# Patient Record
Sex: Female | Born: 1970
Health system: Southern US, Community
[De-identification: ages and names within clinical notes are randomized; demographics above are authoritative.]

## PROBLEM LIST (undated history)

## (undated) DIAGNOSIS — Z789 Other specified health status: Secondary | ICD-10-CM

## (undated) HISTORY — PX: NO PAST SURGERIES: SHX2092

## (undated) HISTORY — DX: Other specified health status: Z78.9

---

## 2003-10-30 ENCOUNTER — Other Ambulatory Visit: Admission: RE | Admit: 2003-10-30 | Discharge: 2003-10-30 | Payer: Self-pay | Admitting: Gynecology

## 2004-10-30 ENCOUNTER — Other Ambulatory Visit: Admission: RE | Admit: 2004-10-30 | Discharge: 2004-10-30 | Payer: Self-pay | Admitting: Gynecology

## 2005-11-02 ENCOUNTER — Other Ambulatory Visit: Admission: RE | Admit: 2005-11-02 | Discharge: 2005-11-02 | Payer: Self-pay | Admitting: Gynecology

## 2006-08-18 ENCOUNTER — Ambulatory Visit (HOSPITAL_COMMUNITY): Admission: RE | Admit: 2006-08-18 | Discharge: 2006-08-18 | Payer: Self-pay | Admitting: Gynecology

## 2006-11-04 ENCOUNTER — Other Ambulatory Visit: Admission: RE | Admit: 2006-11-04 | Discharge: 2006-11-04 | Payer: Self-pay | Admitting: Gynecology

## 2007-05-27 ENCOUNTER — Ambulatory Visit (HOSPITAL_COMMUNITY): Admission: RE | Admit: 2007-05-27 | Discharge: 2007-05-27 | Payer: Self-pay | Admitting: Chiropractic Medicine

## 2007-11-10 ENCOUNTER — Other Ambulatory Visit: Admission: RE | Admit: 2007-11-10 | Discharge: 2007-11-10 | Payer: Self-pay | Admitting: Gynecology

## 2008-02-28 ENCOUNTER — Ambulatory Visit (HOSPITAL_COMMUNITY): Admission: RE | Admit: 2008-02-28 | Discharge: 2008-02-28 | Payer: Self-pay | Admitting: Chiropractic Medicine

## 2008-11-22 ENCOUNTER — Ambulatory Visit: Payer: Self-pay | Admitting: Women's Health

## 2008-11-22 ENCOUNTER — Encounter: Payer: Self-pay | Admitting: Women's Health

## 2008-11-22 ENCOUNTER — Other Ambulatory Visit: Admission: RE | Admit: 2008-11-22 | Discharge: 2008-11-22 | Payer: Self-pay | Admitting: Gynecology

## 2009-11-06 ENCOUNTER — Ambulatory Visit: Payer: Self-pay | Admitting: Women's Health

## 2009-11-06 ENCOUNTER — Other Ambulatory Visit: Admission: RE | Admit: 2009-11-06 | Discharge: 2009-11-06 | Payer: Self-pay | Admitting: Gynecology

## 2010-05-13 ENCOUNTER — Ambulatory Visit (HOSPITAL_COMMUNITY): Admission: RE | Admit: 2010-05-13 | Discharge: 2010-05-13 | Payer: Self-pay | Admitting: Gynecology

## 2010-11-10 ENCOUNTER — Other Ambulatory Visit: Payer: Self-pay | Admitting: Women's Health

## 2010-11-10 ENCOUNTER — Encounter (INDEPENDENT_AMBULATORY_CARE_PROVIDER_SITE_OTHER): Payer: Managed Care, Other (non HMO) | Admitting: Women's Health

## 2010-11-10 ENCOUNTER — Other Ambulatory Visit (HOSPITAL_COMMUNITY)
Admission: RE | Admit: 2010-11-10 | Discharge: 2010-11-10 | Disposition: A | Discharge status: Home / Self Care | Payer: Managed Care, Other (non HMO) | Source: Ambulatory Visit | Attending: Gynecology | Admitting: Gynecology

## 2010-11-10 DIAGNOSIS — Z124 Encounter for screening for malignant neoplasm of cervix: Secondary | ICD-10-CM | POA: Insufficient documentation

## 2010-11-10 DIAGNOSIS — Z01419 Encounter for gynecological examination (general) (routine) without abnormal findings: Secondary | ICD-10-CM

## 2010-11-10 DIAGNOSIS — R823 Hemoglobinuria: Secondary | ICD-10-CM

## 2010-12-26 ENCOUNTER — Ambulatory Visit (INDEPENDENT_AMBULATORY_CARE_PROVIDER_SITE_OTHER): Payer: Managed Care, Other (non HMO) | Admitting: Women's Health

## 2010-12-26 DIAGNOSIS — B3731 Acute candidiasis of vulva and vagina: Secondary | ICD-10-CM

## 2010-12-26 DIAGNOSIS — R809 Proteinuria, unspecified: Secondary | ICD-10-CM

## 2010-12-26 DIAGNOSIS — R350 Frequency of micturition: Secondary | ICD-10-CM

## 2010-12-26 DIAGNOSIS — B373 Candidiasis of vulva and vagina: Secondary | ICD-10-CM

## 2011-04-07 ENCOUNTER — Other Ambulatory Visit: Payer: Self-pay | Admitting: Gynecology

## 2011-04-07 DIAGNOSIS — Z1231 Encounter for screening mammogram for malignant neoplasm of breast: Secondary | ICD-10-CM

## 2011-05-15 ENCOUNTER — Ambulatory Visit (HOSPITAL_COMMUNITY)
Admission: RE | Admit: 2011-05-15 | Discharge: 2011-05-15 | Disposition: A | Discharge status: Home / Self Care | Payer: Managed Care, Other (non HMO) | Source: Ambulatory Visit | Attending: Gynecology | Admitting: Gynecology

## 2011-05-15 ENCOUNTER — Telehealth: Payer: Self-pay | Admitting: *Deleted

## 2011-05-15 DIAGNOSIS — Z1231 Encounter for screening mammogram for malignant neoplasm of breast: Secondary | ICD-10-CM

## 2011-05-15 DIAGNOSIS — Z139 Encounter for screening, unspecified: Secondary | ICD-10-CM

## 2011-05-15 NOTE — Telephone Encounter (Signed)
Pt called needing a glucose level check for her insurance company. Per [place] pt can come in office to have lab drawn.

## 2011-05-18 ENCOUNTER — Other Ambulatory Visit (INDEPENDENT_AMBULATORY_CARE_PROVIDER_SITE_OTHER): Payer: Managed Care, Other (non HMO) | Admitting: *Deleted

## 2011-05-18 DIAGNOSIS — Z139 Encounter for screening, unspecified: Secondary | ICD-10-CM

## 2011-10-30 ENCOUNTER — Encounter: Payer: Self-pay | Admitting: Women's Health

## 2011-11-11 ENCOUNTER — Encounter: Payer: Self-pay | Admitting: Women's Health

## 2011-11-11 ENCOUNTER — Ambulatory Visit (INDEPENDENT_AMBULATORY_CARE_PROVIDER_SITE_OTHER): Payer: Managed Care, Other (non HMO) | Admitting: Women's Health

## 2011-11-11 VITALS — BP 100/68 | Ht 65.0 in | Wt 119.0 lb

## 2011-11-11 DIAGNOSIS — E079 Disorder of thyroid, unspecified: Secondary | ICD-10-CM

## 2011-11-11 DIAGNOSIS — Z13220 Encounter for screening for lipoid disorders: Secondary | ICD-10-CM

## 2011-11-11 DIAGNOSIS — Z01419 Encounter for gynecological examination (general) (routine) without abnormal findings: Secondary | ICD-10-CM

## 2011-11-11 DIAGNOSIS — Z309 Encounter for contraceptive management, unspecified: Secondary | ICD-10-CM

## 2011-11-11 DIAGNOSIS — Z833 Family history of diabetes mellitus: Secondary | ICD-10-CM

## 2011-11-11 DIAGNOSIS — IMO0001 Reserved for inherently not codable concepts without codable children: Secondary | ICD-10-CM

## 2011-11-11 LAB — CBC WITH DIFFERENTIAL/PLATELET
Eosinophils Relative: 2 % (ref 0–5)
HCT: 39.7 % (ref 36.0–46.0)
Lymphocytes Relative: 40 % (ref 12–46)
Lymphs Abs: 1.3 10*3/uL (ref 0.7–4.0)
MCV: 95.9 fL (ref 78.0–100.0)
Monocytes Absolute: 0.3 10*3/uL (ref 0.1–1.0)
Neutro Abs: 1.7 10*3/uL (ref 1.7–7.7)
Platelets: 178 10*3/uL (ref 150–400)
RBC: 4.14 MIL/uL (ref 3.87–5.11)
WBC: 3.3 10*3/uL — ABNORMAL LOW (ref 4.0–10.5)

## 2011-11-11 LAB — LIPID PANEL
Cholesterol: 169 mg/dL (ref 0–200)
HDL: 66 mg/dL (ref >39)
LDL Cholesterol: 83 mg/dL (ref 0–99)
Triglycerides: 101 mg/dL (ref <150)

## 2011-11-11 LAB — TSH: TSH: 0.988 u[IU]/mL (ref 0.350–4.500)

## 2011-11-11 MED ORDER — ETONOGESTREL-ETHINYL ESTRADIOL 0.12-0.015 MG/24HR VA RING: VAGINAL_RING | VAGINAL | Status: DC

## 2011-11-11 NOTE — Patient Instructions (Signed)

## 2011-11-11 NOTE — Progress Notes (Signed)
Linda Lewis 1971-03-16 [id number]    History:    The patient presents for annual exam.  Monthly 4-5 day cycle on NuvaRing. History of normal Paps and mammograms.   Past medical history, past surgical history, family history and social history were all reviewed and documented in the EPIC chart. Works as a Museum/gallery conservator in a lab. Daughter Linda Lewis, 3 student at [employer] doing well, has had gardasil.   ROS:  A  ROS was performed and pertinent positives and negatives are included in the history.  Exam:  Filed Vitals:   11/11/11 0847  BP: 100/68    General appearance:  Normal Head/Neck:  Normal, without cervical or supraclavicular adenopathy. Thyroid:  Symmetrical, normal in size, without palpable masses or nodularity. Respiratory  Effort:  Normal  Auscultation:  Clear without wheezing or rhonchi Cardiovascular  Auscultation:  Regular rate, without rubs, murmurs or gallops  Edema/varicosities:  Not grossly evident Abdominal  Soft,nontender, without masses, guarding or rebound.  Liver/spleen:  No organomegaly noted  Hernia:  None appreciated  Skin  Inspection:  Grossly normal  Palpation:  Grossly normal Neurologic/psychiatric  Orientation:  Normal with appropriate conversation.  Mood/affect:  Normal  Genitourinary    Breasts: Examined lying and sitting.     Right: Without masses, retractions, discharge or axillary adenopathy.     Left: Without masses, retractions, discharge or axillary adenopathy.   Inguinal/mons:  Normal without inguinal adenopathy  External genitalia:  Normal  BUS/Urethra/Skene's glands:  Normal  Bladder:  Normal  Vagina:  Normal  Cervix:  Normal  Uterus:   normal in size, shape and contour.  Midline and mobile  Adnexa/parametria:     Rt: Without masses or tenderness.   Lt: Without masses or tenderness.  Anus and perineum: Normal  Digital rectal exam: Normal sphincter tone without palpated masses or tenderness  Assessment/Plan:  40 y.o. MWF G1 P1 for  annual exam with no complaints.  Normal GYN exam on nuva ring contraception History of  normal Paps  Plan: SBE's, continue annual mammogram, continue healthy lifestyle of diet and exercise, vitamin D 1000 daily encouraged. Nuva ring prescription, proper use, slight risk for blood clots and strokes reviewed. CBC, glucose, TSH, lipid profile, UA. ACOG's pap screening  recommendations reviewed.  Harrington Challenger [facility], 10:38 AM 11/11/2011

## 2011-11-12 LAB — URINALYSIS W MICROSCOPIC + REFLEX CULTURE
Bacteria, UA: NONE SEEN
Bilirubin Urine: NEGATIVE
Casts: NONE SEEN
Crystals: NONE SEEN
Hgb urine dipstick: NEGATIVE
Ketones, ur: NEGATIVE mg/dL
Nitrite: NEGATIVE
pH: 8 (ref 5.0–8.0)

## 2012-02-24 ENCOUNTER — Ambulatory Visit (INDEPENDENT_AMBULATORY_CARE_PROVIDER_SITE_OTHER): Payer: Managed Care, Other (non HMO) | Admitting: Women's Health

## 2012-02-24 ENCOUNTER — Encounter: Payer: Self-pay | Admitting: Women's Health

## 2012-02-24 ENCOUNTER — Other Ambulatory Visit: Payer: Self-pay | Admitting: Women's Health

## 2012-02-24 DIAGNOSIS — R102 Pelvic and perineal pain: Secondary | ICD-10-CM

## 2012-02-24 DIAGNOSIS — B373 Candidiasis of vulva and vagina: Secondary | ICD-10-CM

## 2012-02-24 DIAGNOSIS — N949 Unspecified condition associated with female genital organs and menstrual cycle: Secondary | ICD-10-CM

## 2012-02-24 LAB — URINALYSIS W MICROSCOPIC + REFLEX CULTURE
Bilirubin Urine: NEGATIVE
Hgb urine dipstick: NEGATIVE
Ketones, ur: NEGATIVE mg/dL
Nitrite: NEGATIVE
Protein, ur: NEGATIVE mg/dL
Specific Gravity, Urine: <1.005 — ABNORMAL LOW (ref 1.005–1.030)
Urobilinogen, UA: 0.2 mg/dL (ref 0.0–1.0)

## 2012-02-24 MED ORDER — FLUCONAZOLE 150 MG PO TABS: 150.0000 mg | ORAL_TABLET | Freq: Once | ORAL | Status: AC

## 2012-02-24 NOTE — Patient Instructions (Addendum)

## 2012-02-24 NOTE — Progress Notes (Signed)
Patient ID: Linda Lewis, female   DOB: 02/08/1971, 41 y.o.   MRN: [id number] Presents with complaint of intermittent low right abdominal pain that radiates to the left for 1 week. Pain occasionally sharp. States has felt increased bloating, denies change in bowel elimination, no nausea, diarrhea, fever, urinary symptoms or discharge. Denies change in diet. Contraceptives on NuvaRing without a problem.  Exam: No CVAT, UA: Abdomen soft, no radiation or rebound of pain. Negative. External genitalia within normal limits, speculum exam cervix is pink healthy without lesion or discharge, no noted erythema, wet prep positive for a few yeast. Bimanual no CMT, no left adnexal fullness or tenderness, right-sided tenderness no fullness noted.  Right lower quadrant discomfort for one week.  Plan: Diflucan 150 times one dose, pelvic ultrasound, Lewis schedule.

## 2012-02-24 NOTE — Addendum Note (Signed)
Addended by: Rushie Goltz on: 02/24/2012 12:51 PM   Modules accepted: Orders

## 2012-02-29 ENCOUNTER — Ambulatory Visit (INDEPENDENT_AMBULATORY_CARE_PROVIDER_SITE_OTHER): Payer: Managed Care, Other (non HMO)

## 2012-02-29 ENCOUNTER — Ambulatory Visit (INDEPENDENT_AMBULATORY_CARE_PROVIDER_SITE_OTHER): Payer: Managed Care, Other (non HMO) | Admitting: Women's Health

## 2012-02-29 ENCOUNTER — Other Ambulatory Visit: Payer: Self-pay | Admitting: Gynecology

## 2012-02-29 ENCOUNTER — Encounter: Payer: Self-pay | Admitting: Women's Health

## 2012-02-29 DIAGNOSIS — N83209 Unspecified ovarian cyst, unspecified side: Secondary | ICD-10-CM

## 2012-02-29 DIAGNOSIS — R102 Pelvic and perineal pain: Secondary | ICD-10-CM

## 2012-02-29 DIAGNOSIS — N949 Unspecified condition associated with female genital organs and menstrual cycle: Secondary | ICD-10-CM

## 2012-02-29 DIAGNOSIS — N831 Corpus luteum cyst of ovary, unspecified side: Secondary | ICD-10-CM

## 2012-02-29 DIAGNOSIS — R1031 Right lower quadrant pain: Secondary | ICD-10-CM

## 2012-02-29 NOTE — Progress Notes (Signed)
Patient ID: Linda Lewis, female   DOB: 1970/09/11, 41 y.o.   MRN: [id number] Presents for ultrasound due to lower right quadrant pain. Was seen in the office 02/24/12 with complaints of bloating, pressure sensation with pain. States pain increased on 8/16 has gotten progressively better, pain now minimal. Denied nausea, urinary symptoms, discharge, fever or changes in bowel elimination. Contraceptives on nuva ring without a problem.  Ultrasound: Anteverted uterus arcuate. Does not appear septated. Right ovary thickwalled cystic 23 x 23 x 19 mm positive PFT. Left ovary normal minimal fluid in cul-de-sac.  Right ovarian cyst  Plan: Reviewed cyst most likely functional that is resolving. Will recheck ultrasound for complete resolution after November cycle, instructed to call to schedule.

## 2012-04-26 ENCOUNTER — Other Ambulatory Visit: Payer: Self-pay | Admitting: Gynecology

## 2012-04-26 DIAGNOSIS — Z1231 Encounter for screening mammogram for malignant neoplasm of breast: Secondary | ICD-10-CM

## 2012-05-17 ENCOUNTER — Ambulatory Visit (HOSPITAL_COMMUNITY)
Admission: RE | Admit: 2012-05-17 | Discharge: 2012-05-17 | Disposition: A | Discharge status: Home / Self Care | Payer: Managed Care, Other (non HMO) | Source: Ambulatory Visit | Attending: Gynecology | Admitting: Gynecology

## 2012-05-17 DIAGNOSIS — Z1231 Encounter for screening mammogram for malignant neoplasm of breast: Secondary | ICD-10-CM | POA: Insufficient documentation

## 2012-07-29 ENCOUNTER — Telehealth: Payer: Self-pay | Admitting: *Deleted

## 2012-07-29 DIAGNOSIS — IMO0001 Reserved for inherently not codable concepts without codable children: Secondary | ICD-10-CM

## 2012-07-29 MED ORDER — ETONOGESTREL-ETHINYL ESTRADIOL 0.12-0.015 MG/24HR VA RING: VAGINAL_RING | VAGINAL | Status: DC

## 2012-07-29 NOTE — Telephone Encounter (Signed)
Pt called requesting refill on nuvaring for new mail order pharmacy. [employer] rx sent.

## 2012-11-11 ENCOUNTER — Encounter: Payer: Self-pay | Admitting: Women's Health

## 2012-11-11 ENCOUNTER — Ambulatory Visit (INDEPENDENT_AMBULATORY_CARE_PROVIDER_SITE_OTHER): Payer: Managed Care, Other (non HMO) | Admitting: Women's Health

## 2012-11-11 ENCOUNTER — Other Ambulatory Visit (HOSPITAL_COMMUNITY)
Admission: RE | Admit: 2012-11-11 | Discharge: 2012-11-11 | Disposition: A | Discharge status: Home / Self Care | Payer: Managed Care, Other (non HMO) | Source: Ambulatory Visit | Attending: Obstetrics and Gynecology | Admitting: Obstetrics and Gynecology

## 2012-11-11 VITALS — BP 112/66 | Ht 65.0 in | Wt 116.0 lb

## 2012-11-11 DIAGNOSIS — Z01419 Encounter for gynecological examination (general) (routine) without abnormal findings: Secondary | ICD-10-CM

## 2012-11-11 DIAGNOSIS — Z833 Family history of diabetes mellitus: Secondary | ICD-10-CM

## 2012-11-11 LAB — CBC WITH DIFFERENTIAL/PLATELET
Basophils Relative: 1 % (ref 0–1)
Eosinophils Absolute: 0.1 10*3/uL (ref 0.0–0.7)
Eosinophils Relative: 1 % (ref 0–5)
Lymphs Abs: 1.5 10*3/uL (ref 0.7–4.0)
MCH: 31.1 pg (ref 26.0–34.0)
MCHC: 33 g/dL (ref 30.0–36.0)
MCV: 94.2 fL (ref 78.0–100.0)
Monocytes Relative: 7 % (ref 3–12)
Neutrophils Relative %: 50 % (ref 43–77)
Platelets: 198 10*3/uL (ref 150–400)
RBC: 4.28 MIL/uL (ref 3.87–5.11)

## 2012-11-11 LAB — COMPREHENSIVE METABOLIC PANEL
Alkaline Phosphatase: 63 U/L (ref 39–117)
CO2: 27 mEq/L (ref 19–32)
Creat: 0.9 mg/dL (ref 0.50–1.10)
Glucose, Bld: 90 mg/dL (ref 70–99)
Total Bilirubin: 0.6 mg/dL (ref 0.3–1.2)

## 2012-11-11 NOTE — Patient Instructions (Signed)

## 2012-11-11 NOTE — Progress Notes (Signed)
Linda Lewis [birth year] [id number]    History:    The patient presents for annual exam.  Regular monthly cycle on NuvaRing without complaint. History of normal Paps and mammograms.   Past medical history, past surgical history, family history and social history were all reviewed and documented in the EPIC chart. 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 Linda Lewis, 42 doing well at [employer].   ROS:  A  ROS was performed and pertinent positives and negatives are included in the history.  Exam:  Filed Vitals:   11/11/12 0832  BP: 112/66    General appearance:  Normal Head/Neck:  Normal, without cervical or supraclavicular adenopathy. Thyroid:  Symmetrical, normal in size, without palpable masses or nodularity. Respiratory  Effort:  Normal  Auscultation:  Clear without wheezing or rhonchi Cardiovascular  Auscultation:  Regular rate, without rubs, murmurs or gallops  Edema/varicosities:  Not grossly evident Abdominal  Soft,nontender, without masses, guarding or rebound.  Liver/spleen:  No organomegaly noted  Hernia:  None appreciated  Skin  Inspection:  Grossly normal  Palpation:  Grossly normal Neurologic/psychiatric  Orientation:  Normal with appropriate conversation.  Mood/affect:  Normal  Genitourinary    Breasts: Examined lying and sitting.     Right: Without masses, retractions, discharge or axillary adenopathy.     Left: Without masses, retractions, discharge or axillary adenopathy.   Inguinal/mons:  Normal without inguinal adenopathy  External genitalia:  Normal  BUS/Urethra/Skene's glands:  Normal  Bladder:  Normal  Vagina:  Normal  Cervix:  Normal  Uterus:  normal in size, shape and contour.  Midline and mobile  Adnexa/parametria:     Rt: Without masses or tenderness.   Lt: Without masses or tenderness.  Anus and perineum: Normal  Digital rectal exam: Normal sphincter tone without palpated masses or tenderness  Assessment/Plan:  42 y.o. MWF  G1P1 for annual exam.     Normal GYN exam on NuvaRing  Plan: NuvaRing prescription, proper use, slight risk for blood clots and strokes reviewed. Other options reviewed and declined. SBE's, continue annual mammogram, calcium rich diet, vitamin D 2000 daily encouraged. Continue regular exercise and healthy lifestyle. CBC, CMET. UA, Pap, Pap normal 2012, new screening guidelines reviewed. Lipid panel excellent 2013.    Harrington Challenger [facility], 11:30 AM 11/11/2012

## 2012-11-12 LAB — URINALYSIS W MICROSCOPIC + REFLEX CULTURE
Bilirubin Urine: NEGATIVE
Crystals: NONE SEEN
Glucose, UA: NEGATIVE mg/dL
Leukocytes, UA: NEGATIVE
Protein, ur: NEGATIVE mg/dL
Specific Gravity, Urine: 1.021 (ref 1.005–1.030)
pH: 7.5 (ref 5.0–8.0)

## 2013-04-19 ENCOUNTER — Other Ambulatory Visit: Payer: Self-pay | Admitting: Women's Health

## 2013-04-19 DIAGNOSIS — Z1231 Encounter for screening mammogram for malignant neoplasm of breast: Secondary | ICD-10-CM

## 2013-05-22 ENCOUNTER — Ambulatory Visit (HOSPITAL_COMMUNITY): Payer: Managed Care, Other (non HMO)

## 2013-06-19 ENCOUNTER — Ambulatory Visit (HOSPITAL_COMMUNITY)
Admission: RE | Admit: 2013-06-19 | Discharge: 2013-06-19 | Disposition: A | Discharge status: Home / Self Care | Payer: 59 | Source: Ambulatory Visit | Attending: Women's Health | Admitting: Women's Health

## 2013-06-19 DIAGNOSIS — Z1231 Encounter for screening mammogram for malignant neoplasm of breast: Secondary | ICD-10-CM | POA: Insufficient documentation

## 2013-07-17 ENCOUNTER — Telehealth: Payer: Self-pay | Admitting: *Deleted

## 2013-07-17 DIAGNOSIS — IMO0001 Reserved for inherently not codable concepts without codable children: Secondary | ICD-10-CM

## 2013-07-17 MED ORDER — ETONOGESTREL-ETHINYL ESTRADIOL 0.12-0.015 MG/24HR VA RING: VAGINAL_RING | VAGINAL | Status: DC

## 2013-07-17 NOTE — Telephone Encounter (Signed)
Pt called requesting refill on nuvaring, coupon left on front for pick up to use a pharmacy.

## 2013-11-15 ENCOUNTER — Ambulatory Visit (INDEPENDENT_AMBULATORY_CARE_PROVIDER_SITE_OTHER): Payer: 59 | Admitting: Women's Health

## 2013-11-15 ENCOUNTER — Encounter: Payer: Self-pay | Admitting: Women's Health

## 2013-11-15 VITALS — BP 108/64 | Ht 64.5 in | Wt 116.0 lb

## 2013-11-15 DIAGNOSIS — IMO0001 Reserved for inherently not codable concepts without codable children: Secondary | ICD-10-CM

## 2013-11-15 DIAGNOSIS — Z309 Encounter for contraceptive management, unspecified: Secondary | ICD-10-CM

## 2013-11-15 DIAGNOSIS — Z01419 Encounter for gynecological examination (general) (routine) without abnormal findings: Secondary | ICD-10-CM

## 2013-11-15 LAB — CBC WITH DIFFERENTIAL/PLATELET
BASOS ABS: 0.1 10*3/uL (ref 0.0–0.1)
Basophils Relative: 1 % (ref 0–1)
Eosinophils Absolute: 0.1 10*3/uL (ref 0.0–0.7)
Eosinophils Relative: 2 % (ref 0–5)
HCT: 38.9 % (ref 36.0–46.0)
Hemoglobin: 13.3 g/dL (ref 12.0–15.0)
LYMPHS PCT: 32 % (ref 12–46)
Lymphs Abs: 1.6 10*3/uL (ref 0.7–4.0)
MCH: 31.5 pg (ref 26.0–34.0)
MCHC: 34.2 g/dL (ref 30.0–36.0)
MCV: 92.2 fL (ref 78.0–100.0)
Monocytes Absolute: 0.5 10*3/uL (ref 0.1–1.0)
Monocytes Relative: 9 % (ref 3–12)
Neutro Abs: 2.8 10*3/uL (ref 1.7–7.7)
Neutrophils Relative %: 56 % (ref 43–77)
PLATELETS: 217 10*3/uL (ref 150–400)
RBC: 4.22 MIL/uL (ref 3.87–5.11)
RDW: 12.9 % (ref 11.5–15.5)
WBC: 5 10*3/uL (ref 4.0–10.5)

## 2013-11-15 MED ORDER — ETONOGESTREL-ETHINYL ESTRADIOL 0.12-0.015 MG/24HR VA RING: VAGINAL_RING | VAGINAL | Status: DC

## 2013-11-15 NOTE — Progress Notes (Signed)
[name] Nov 16, 1970 [id number]    History:    Presents for annual exam.  Monthly cycle on NuvaRing with no complaints. History of normal Paps and mammograms.  Past medical history, past surgical history, family history and social history were all reviewed and documented in the EPIC chart. 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  ROS:  A  12 point ROS was performed and pertinent positives and negatives are included.  Exam:  Filed Vitals:   11/15/13 1527  BP: 108/64    General appearance:  Normal Thyroid:  Symmetrical, normal in size, without palpable masses or nodularity. Respiratory  Auscultation:  Clear without wheezing or rhonchi Cardiovascular  Auscultation:  Regular rate, without rubs, murmurs or gallops  Edema/varicosities:  Not grossly evident Abdominal  Soft,nontender, without masses, guarding or rebound.  Liver/spleen:  No organomegaly noted  Hernia:  None appreciated  Skin  Inspection:  Grossly normal   Breasts: Examined lying and sitting.     Right: Without masses, retractions, discharge or axillary adenopathy.     Left: Without masses, retractions, discharge or axillary adenopathy. Gentitourinary   Inguinal/mons:  Normal without inguinal adenopathy  External genitalia:  Normal  BUS/Urethra/Skene's glands:  Normal  Vagina:  Normal  Cervix:  Normal  Uterus:   normal in size, shape and contour.  Midline and mobile  Adnexa/parametria:     Rt: Without masses or tenderness.   Lt: Without masses or tenderness.  Anus and perineum: Normal  Digital rectal exam: Normal sphincter tone without palpated masses or tenderness  Assessment/Plan:  43 y.o. MWF G2P1 for annual exam.    Normal GYN exam on NuvaRing  Plan: NuvaRing prescription, proper use, slight risk for blood clots and strokes reviewed. SBE's, continue annual screening mammogram, 3D tomography encouraged,  history of dense breast. Continue regular  exercise, healthy diet, calcium rich diet, vitamin D 1000 daily. CBC, UA, Pap, normal with no endocervical cells 2014. New screening guidelines reviewed.   [place], 4:39 PM 11/15/2013

## 2013-11-15 NOTE — Patient Instructions (Signed)

## 2013-11-16 ENCOUNTER — Other Ambulatory Visit (HOSPITAL_COMMUNITY)
Admission: RE | Admit: 2013-11-16 | Discharge: 2013-11-16 | Disposition: A | Discharge status: Home / Self Care | Payer: 59 | Source: Ambulatory Visit | Attending: Gynecology | Admitting: Gynecology

## 2013-11-16 DIAGNOSIS — Z01419 Encounter for gynecological examination (general) (routine) without abnormal findings: Secondary | ICD-10-CM | POA: Insufficient documentation

## 2013-11-16 LAB — URINALYSIS W MICROSCOPIC + REFLEX CULTURE
BACTERIA UA: NONE SEEN
Bilirubin Urine: NEGATIVE
Casts: NONE SEEN
Crystals: NONE SEEN
Glucose, UA: NEGATIVE mg/dL
Ketones, ur: NEGATIVE mg/dL
Leukocytes, UA: NEGATIVE
NITRITE: NEGATIVE
Protein, ur: NEGATIVE mg/dL
RBC / HPF: >50 RBC/hpf — AB (ref <3)
SPECIFIC GRAVITY, URINE: 1.02 (ref 1.005–1.030)
Squamous Epithelial / LPF: NONE SEEN
UROBILINOGEN UA: 0.2 mg/dL (ref 0.0–1.0)
pH: 6.5 (ref 5.0–8.0)

## 2013-11-16 NOTE — Addendum Note (Signed)
Addended by: Alen Blew on: 11/16/2013 10:21 AM   Modules accepted: Orders

## 2013-11-17 LAB — URINE CULTURE
Colony Count: NO GROWTH
ORGANISM ID, BACTERIA: NO GROWTH

## 2014-05-08 ENCOUNTER — Other Ambulatory Visit: Payer: Self-pay | Admitting: *Deleted

## 2014-05-08 MED ORDER — ETONOGESTREL-ETHINYL ESTRADIOL 0.12-0.015 MG/24HR VA RING: VAGINAL_RING | VAGINAL | Status: DC

## 2014-05-14 ENCOUNTER — Encounter: Payer: Self-pay | Admitting: Women's Health

## 2014-05-18 ENCOUNTER — Other Ambulatory Visit: Payer: Self-pay | Admitting: Women's Health

## 2014-05-18 DIAGNOSIS — Z1231 Encounter for screening mammogram for malignant neoplasm of breast: Secondary | ICD-10-CM

## 2014-06-25 ENCOUNTER — Ambulatory Visit (HOSPITAL_COMMUNITY)
Admission: RE | Admit: 2014-06-25 | Discharge: 2014-06-25 | Disposition: A | Discharge status: Home / Self Care | Payer: BC Managed Care – PPO | Source: Ambulatory Visit | Attending: Women's Health | Admitting: Women's Health

## 2014-06-25 DIAGNOSIS — Z1231 Encounter for screening mammogram for malignant neoplasm of breast: Secondary | ICD-10-CM | POA: Diagnosis not present

## 2014-12-06 ENCOUNTER — Encounter: Payer: Self-pay | Admitting: Women's Health

## 2014-12-06 ENCOUNTER — Ambulatory Visit (INDEPENDENT_AMBULATORY_CARE_PROVIDER_SITE_OTHER): Payer: BLUE CROSS/BLUE SHIELD | Admitting: Women's Health

## 2014-12-06 VITALS — BP 124/80 | Ht 64.0 in | Wt 120.0 lb

## 2014-12-06 DIAGNOSIS — Z01419 Encounter for gynecological examination (general) (routine) without abnormal findings: Secondary | ICD-10-CM | POA: Diagnosis not present

## 2014-12-06 DIAGNOSIS — Z13220 Encounter for screening for lipoid disorders: Secondary | ICD-10-CM

## 2014-12-06 DIAGNOSIS — B373 Candidiasis of vulva and vagina: Secondary | ICD-10-CM

## 2014-12-06 DIAGNOSIS — Z3041 Encounter for surveillance of contraceptive pills: Secondary | ICD-10-CM | POA: Diagnosis not present

## 2014-12-06 DIAGNOSIS — B3731 Acute candidiasis of vulva and vagina: Secondary | ICD-10-CM

## 2014-12-06 LAB — COMPREHENSIVE METABOLIC PANEL
ALT: 14 U/L (ref 0–35)
AST: 18 U/L (ref 0–37)
Albumin: 3.7 g/dL (ref 3.5–5.2)
Alkaline Phosphatase: 89 U/L (ref 39–117)
BUN: 14 mg/dL (ref 6–23)
CHLORIDE: 107 meq/L (ref 96–112)
CO2: 26 mEq/L (ref 19–32)
Calcium: 8.9 mg/dL (ref 8.4–10.5)
Creat: 0.75 mg/dL (ref 0.50–1.10)
GLUCOSE: 105 mg/dL — AB (ref 70–99)
Potassium: 4.4 mEq/L (ref 3.5–5.3)
SODIUM: 139 meq/L (ref 135–145)
Total Bilirubin: 0.3 mg/dL (ref 0.2–1.2)
Total Protein: 6.3 g/dL (ref 6.0–8.3)

## 2014-12-06 LAB — CBC WITH DIFFERENTIAL/PLATELET
BASOS PCT: 1 % (ref 0–1)
Basophils Absolute: 0 10*3/uL (ref 0.0–0.1)
Eosinophils Absolute: 0.1 10*3/uL (ref 0.0–0.7)
Eosinophils Relative: 2 % (ref 0–5)
HCT: 35.5 % — ABNORMAL LOW (ref 36.0–46.0)
HEMOGLOBIN: 11.8 g/dL — AB (ref 12.0–15.0)
LYMPHS ABS: 1.5 10*3/uL (ref 0.7–4.0)
LYMPHS PCT: 37 % (ref 12–46)
MCH: 31 pg (ref 26.0–34.0)
MCHC: 33.2 g/dL (ref 30.0–36.0)
MCV: 93.2 fL (ref 78.0–100.0)
MPV: 10.6 fL (ref 8.6–12.4)
Monocytes Absolute: 0.4 10*3/uL (ref 0.1–1.0)
Monocytes Relative: 10 % (ref 3–12)
Neutro Abs: 2 10*3/uL (ref 1.7–7.7)
Neutrophils Relative %: 50 % (ref 43–77)
Platelets: 221 10*3/uL (ref 150–400)
RBC: 3.81 MIL/uL — AB (ref 3.87–5.11)
RDW: 13.2 % (ref 11.5–15.5)
WBC: 4 10*3/uL (ref 4.0–10.5)

## 2014-12-06 LAB — LIPID PANEL
CHOLESTEROL: 165 mg/dL (ref 0–200)
HDL: 63 mg/dL (ref >=46)
LDL Cholesterol: 87 mg/dL (ref 0–99)
Total CHOL/HDL Ratio: 2.6 Ratio
Triglycerides: 74 mg/dL (ref <150)
VLDL: 15 mg/dL (ref 0–40)

## 2014-12-06 MED ORDER — ETONOGESTREL-ETHINYL ESTRADIOL 0.12-0.015 MG/24HR VA RING: VAGINAL_RING | VAGINAL | Status: DC

## 2014-12-06 MED ORDER — FLUCONAZOLE 150 MG PO TABS: 150.0000 mg | ORAL_TABLET | Freq: Once | ORAL | Status: DC

## 2014-12-06 NOTE — Progress Notes (Signed)
Linda Lewis May 11, 1971 [id number]    History:    Presents for annual exam.  Light monthly cycle on NuvaRing. Normal Pap and mammogram history.  Past medical history, past surgical history, family history and social history were all reviewed and documented in the EPIC chart. 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  ROS:  A ROS was performed and pertinent positives and negatives are included.  Exam:  Filed Vitals:   12/06/14 1550  BP: 124/80    General appearance:  Normal Thyroid:  Symmetrical, normal in size, without palpable masses or nodularity. Respiratory  Auscultation:  Clear without wheezing or rhonchi Cardiovascular  Auscultation:  Regular rate, without rubs, murmurs or gallops  Edema/varicosities:  Not grossly evident Abdominal  Soft,nontender, without masses, guarding or rebound.  Liver/spleen:  No organomegaly noted  Hernia:  None appreciated  Skin  Inspection:  Grossly normal   Breasts: Examined lying and sitting.     Right: Without masses, retractions, discharge or axillary adenopathy.     Left: Without masses, retractions, discharge or axillary adenopathy. Gentitourinary   Inguinal/mons:  Normal without inguinal adenopathy  External genitalia:  Normal  BUS/Urethra/Skene's glands:  Normal  Vagina:  Normal  Cervix:  Normal  Uterus:   normal in size, shape and contour.  Midline and mobile  Adnexa/parametria:     Rt: Without masses or tenderness.   Lt: Without masses or tenderness.  Anus and perineum: Normal  Digital rectal exam: Normal sphincter tone without palpated masses or tenderness  Assessment/Plan:  44 y.o. MWF G2P1 for annual exam with no complaints.  Light monthly cycle on NuvaRing  Plan: NuvaRing prescription, proper use, slight risk for blood clots and strokes reviewed. SBE's, continue annual 3-D screening mammogram history of dense breasts. Regular exercise, calcium rich diet,  vitamin D 1000 daily encouraged. CBC, lipid panel,  CMP, UA, Pap normal 2015, new screening guidelines reviewed.  [place], 4:16 PM 12/06/2014

## 2014-12-06 NOTE — Addendum Note (Signed)
Addended by: Huel Cote on: 12/06/2014 04:40 PM   Modules accepted: Orders

## 2014-12-06 NOTE — Patient Instructions (Signed)

## 2014-12-07 LAB — URINALYSIS W MICROSCOPIC + REFLEX CULTURE
Bacteria, UA: NONE SEEN
Bilirubin Urine: NEGATIVE
CRYSTALS: NONE SEEN
Casts: NONE SEEN
Glucose, UA: NEGATIVE mg/dL
Ketones, ur: NEGATIVE mg/dL
Leukocytes, UA: NEGATIVE
NITRITE: NEGATIVE
PH: 6 (ref 5.0–8.0)
Protein, ur: NEGATIVE mg/dL
RBC / HPF: >50 RBC/hpf — AB (ref <3)
Specific Gravity, Urine: 1.009 (ref 1.005–1.030)
Squamous Epithelial / LPF: NONE SEEN
Urobilinogen, UA: 0.2 mg/dL (ref 0.0–1.0)

## 2014-12-08 LAB — URINE CULTURE

## 2015-01-01 ENCOUNTER — Other Ambulatory Visit: Payer: Self-pay | Admitting: Women's Health

## 2015-01-29 ENCOUNTER — Telehealth: Payer: Self-pay | Admitting: *Deleted

## 2015-01-29 ENCOUNTER — Other Ambulatory Visit: Payer: Self-pay | Admitting: Women's Health

## 2015-01-29 DIAGNOSIS — B373 Candidiasis of vulva and vagina: Secondary | ICD-10-CM

## 2015-01-29 DIAGNOSIS — B3731 Acute candidiasis of vulva and vagina: Secondary | ICD-10-CM

## 2015-01-29 MED ORDER — FLUCONAZOLE 150 MG PO TABS: 150.0000 mg | ORAL_TABLET | Freq: Once | ORAL | Status: DC

## 2015-01-29 NOTE — Telephone Encounter (Signed)
Telephone call, states having vaginal itching only no odor. Diflucan 150 times one dose office visit if no relief.

## 2015-01-29 NOTE — Telephone Encounter (Signed)
Pt called c/o yeast infection itching and white discharge. Pt asked if diflucan tablet could be given? Annual was in May.  Please advise

## 2015-05-27 ENCOUNTER — Other Ambulatory Visit: Payer: Self-pay

## 2015-05-27 DIAGNOSIS — Z1231 Encounter for screening mammogram for malignant neoplasm of breast: Secondary | ICD-10-CM

## 2015-06-21 ENCOUNTER — Other Ambulatory Visit: Payer: Self-pay | Admitting: Women's Health

## 2015-07-01 ENCOUNTER — Ambulatory Visit
Admission: RE | Admit: 2015-07-01 | Discharge: 2015-07-01 | Disposition: A | Discharge status: Home / Self Care | Payer: BLUE CROSS/BLUE SHIELD | Source: Ambulatory Visit

## 2015-07-01 DIAGNOSIS — Z1231 Encounter for screening mammogram for malignant neoplasm of breast: Secondary | ICD-10-CM

## 2016-01-01 ENCOUNTER — Encounter: Payer: Self-pay | Admitting: Women's Health

## 2016-01-01 ENCOUNTER — Ambulatory Visit (INDEPENDENT_AMBULATORY_CARE_PROVIDER_SITE_OTHER): Payer: BLUE CROSS/BLUE SHIELD | Admitting: Women's Health

## 2016-01-01 VITALS — BP 128/80 | Ht 64.0 in | Wt 124.0 lb

## 2016-01-01 DIAGNOSIS — Z1329 Encounter for screening for other suspected endocrine disorder: Secondary | ICD-10-CM | POA: Diagnosis not present

## 2016-01-01 DIAGNOSIS — Z833 Family history of diabetes mellitus: Secondary | ICD-10-CM | POA: Diagnosis not present

## 2016-01-01 DIAGNOSIS — Z3040 Encounter for surveillance of contraceptives, unspecified: Secondary | ICD-10-CM

## 2016-01-01 DIAGNOSIS — B3731 Acute candidiasis of vulva and vagina: Secondary | ICD-10-CM

## 2016-01-01 DIAGNOSIS — B373 Candidiasis of vulva and vagina: Secondary | ICD-10-CM | POA: Diagnosis not present

## 2016-01-01 DIAGNOSIS — Z01419 Encounter for gynecological examination (general) (routine) without abnormal findings: Secondary | ICD-10-CM

## 2016-01-01 LAB — CBC WITH DIFFERENTIAL/PLATELET
BASOS ABS: 46 {cells}/uL (ref 0–200)
Basophils Relative: 1 %
EOS PCT: 2 %
Eosinophils Absolute: 92 cells/uL (ref 15–500)
HCT: 37.9 % (ref 35.0–45.0)
HEMOGLOBIN: 12.8 g/dL (ref 11.7–15.5)
LYMPHS ABS: 1886 {cells}/uL (ref 850–3900)
Lymphocytes Relative: 41 %
MCH: 31.4 pg (ref 27.0–33.0)
MCHC: 33.8 g/dL (ref 32.0–36.0)
MCV: 92.9 fL (ref 80.0–100.0)
MONOS PCT: 7 %
MPV: 10.1 fL (ref 7.5–12.5)
Monocytes Absolute: 322 cells/uL (ref 200–950)
NEUTROS ABS: 2254 {cells}/uL (ref 1500–7800)
Neutrophils Relative %: 49 %
PLATELETS: 225 10*3/uL (ref 140–400)
RBC: 4.08 MIL/uL (ref 3.80–5.10)
RDW: 12.8 % (ref 11.0–15.0)
WBC: 4.6 10*3/uL (ref 3.8–10.8)

## 2016-01-01 LAB — TSH: TSH: 0.71 m[IU]/L

## 2016-01-01 MED ORDER — FLUCONAZOLE 150 MG PO TABS: 150.0000 mg | ORAL_TABLET | Freq: Once | ORAL | Status: DC

## 2016-01-01 MED ORDER — ETONOGESTREL-ETHINYL ESTRADIOL 0.12-0.015 MG/24HR VA RING: VAGINAL_RING | VAGINAL | Status: DC

## 2016-01-01 NOTE — Patient Instructions (Signed)
Health Maintenance, Female Adopting a healthy lifestyle and getting preventive care can go a long way to promote health and wellness. Talk with your health care provider about what schedule of regular examinations is right for you. This is a good chance for you to check in with your provider about disease prevention and staying healthy. In between checkups, there are plenty of things you can do on your own. Experts have done a lot of research about which lifestyle changes and preventive measures are most likely to keep you healthy. Ask your health care provider for more information. WEIGHT AND DIET  Eat a healthy diet  Be sure to include plenty of vegetables, fruits, low-fat dairy products, and lean protein.  Do not eat a lot of foods high in solid fats, added sugars, or salt.  Get regular exercise. This is one of the most important things you can do for your health.  Most adults should exercise for at least 150 minutes each week. The exercise should increase your heart rate and make you sweat (moderate-intensity exercise).  Most adults should also do strengthening exercises at least twice a week. This is in addition to the moderate-intensity exercise.  Maintain a healthy weight  Body mass index (BMI) is a measurement that can be used to identify possible weight problems. It estimates body fat based on height and weight. Your health care provider can help determine your BMI and help you achieve or maintain a healthy weight.  For females 20 years of age and older:   A BMI below 18.5 is considered underweight.  A BMI of 18.5 to 24.9 is normal.  A BMI of 25 to 29.9 is considered overweight.  A BMI of 30 and above is considered obese.  Watch levels of cholesterol and blood lipids  You should start having your blood tested for lipids and cholesterol at 46 years of age, then have this test every 5 years.  You may need to have your cholesterol levels checked more often if:  Your lipid  or cholesterol levels are high.  You are older than 46 years of age.  You are at high risk for heart disease.  CANCER SCREENING   Lung Cancer  Lung cancer screening is recommended for adults 55-80 years old who are at high risk for lung cancer because of a history of smoking.  A yearly low-dose CT scan of the lungs is recommended for people who:  Currently smoke.  Have quit within the past 15 years.  Have at least a 30-pack-year history of smoking. A pack year is smoking an average of one pack of cigarettes a day for 1 year.  Yearly screening should continue until it has been 15 years since you quit.  Yearly screening should stop if you develop a health problem that would prevent you from having lung cancer treatment.  Breast Cancer  Practice breast self-awareness. This means understanding how your breasts normally appear and feel.  It also means doing regular breast self-exams. Let your health care provider know about any changes, no matter how small.  If you are in your 20s or 30s, you should have a clinical breast exam (CBE) by a health care provider every 1-3 years as part of a regular health exam.  If you are 40 or older, have a CBE every year. Also consider having a breast X-ray (mammogram) every year.  If you have a family history of breast cancer, talk to your health care provider about genetic screening.  If you   are at high risk for breast cancer, talk to your health care provider about having an MRI and a mammogram every year.  Breast cancer gene (BRCA) assessment is recommended for women who have family members with BRCA-related cancers. BRCA-related cancers include:  Breast.  Ovarian.  Tubal.  Peritoneal cancers.  Results of the assessment will determine the need for genetic counseling and BRCA1 and BRCA2 testing. Cervical Cancer Your health care provider may recommend that you be screened regularly for cancer of the pelvic organs (ovaries, uterus, and  vagina). This screening involves a pelvic examination, including checking for microscopic changes to the surface of your cervix (Pap test). You may be encouraged to have this screening done every 3 years, beginning at age 21.  For women ages 30-65, health care providers may recommend pelvic exams and Pap testing every 3 years, or they may recommend the Pap and pelvic exam, combined with testing for human papilloma virus (HPV), every 5 years. Some types of HPV increase your risk of cervical cancer. Testing for HPV may also be done on women of any age with unclear Pap test results.  Other health care providers may not recommend any screening for nonpregnant women who are considered low risk for pelvic cancer and who do not have symptoms. Ask your health care provider if a screening pelvic exam is right for you.  If you have had past treatment for cervical cancer or a condition that could lead to cancer, you need Pap tests and screening for cancer for at least 20 years after your treatment. If Pap tests have been discontinued, your risk factors (such as having a new sexual partner) need to be reassessed to determine if screening should resume. Some women have medical problems that increase the chance of getting cervical cancer. In these cases, your health care provider may recommend more frequent screening and Pap tests. Colorectal Cancer  This type of cancer can be detected and often prevented.  Routine colorectal cancer screening usually begins at 46 years of age and continues through 46 years of age.  Your health care provider may recommend screening at an earlier age if you have risk factors for colon cancer.  Your health care provider may also recommend using home test kits to check for hidden blood in the stool.  A small camera at the end of a tube can be used to examine your colon directly (sigmoidoscopy or colonoscopy). This is done to check for the earliest forms of colorectal  cancer.  Routine screening usually begins at age 50.  Direct examination of the colon should be repeated every 5-10 years through 46 years of age. However, you may need to be screened more often if early forms of precancerous polyps or small growths are found. Skin Cancer  Check your skin from head to toe regularly.  Tell your health care provider about any new moles or changes in moles, especially if there is a change in a mole's shape or color.  Also tell your health care provider if you have a mole that is larger than the size of a pencil eraser.  Always use sunscreen. Apply sunscreen liberally and repeatedly throughout the day.  Protect yourself by wearing long sleeves, pants, a wide-brimmed hat, and sunglasses whenever you are outside. HEART DISEASE, DIABETES, AND HIGH BLOOD PRESSURE   High blood pressure causes heart disease and increases the risk of stroke. High blood pressure is more likely to develop in:  People who have blood pressure in the high end   of the normal range (130-139/85-89 mm Hg).  People who are overweight or obese.  People who are African American.  If you are 38-23 years of age, have your blood pressure checked every 3-5 years. If you are 61 years of age or older, have your blood pressure checked every year. You should have your blood pressure measured twice--once when you are at a hospital or clinic, and once when you are not at a hospital or clinic. Record the average of the two measurements. To check your blood pressure when you are not at a hospital or clinic, you can use:  An automated blood pressure machine at a pharmacy.  A home blood pressure monitor.  If you are between 45 years and 39 years old, ask your health care provider if you should take aspirin to prevent strokes.  Have regular diabetes screenings. This involves taking a blood sample to check your fasting blood sugar level.  If you are at a normal weight and have a low risk for diabetes,  have this test once every three years after 46 years of age.  If you are overweight and have a high risk for diabetes, consider being tested at a younger age or more often. PREVENTING INFECTION  Hepatitis B  If you have a higher risk for hepatitis B, you should be screened for this virus. You are considered at high risk for hepatitis B if:  You were born in a country where hepatitis B is common. Ask your health care provider which countries are considered high risk.  Your parents were born in a high-risk country, and you have not been immunized against hepatitis B (hepatitis B vaccine).  You have HIV or AIDS.  You use needles to inject street drugs.  You live with someone who has hepatitis B.  You have had sex with someone who has hepatitis B.  You get hemodialysis treatment.  You take certain medicines for conditions, including cancer, organ transplantation, and autoimmune conditions. Hepatitis C  Blood testing is recommended for:  Everyone born from [birth year] through 1966.  Anyone with known risk factors for hepatitis C. Sexually transmitted infections (STIs)  You should be screened for sexually transmitted infections (STIs) including gonorrhea and chlamydia if:  You are sexually active and are younger than 46 years of age.  You are older than 46 years of age and your health care provider tells you that you are at risk for this type of infection.  Your sexual activity has changed since you were last screened and you are at an increased risk for chlamydia or gonorrhea. Ask your health care provider if you are at risk.  If you do not have HIV, but are at risk, it may be recommended that you take a prescription medicine daily to prevent HIV infection. This is called pre-exposure prophylaxis (PrEP). You are considered at risk if:  You are sexually active and do not regularly use condoms or know the HIV status of your partner(s).  You take drugs by injection.  You are sexually  active with a partner who has HIV. Talk with your health care provider about whether you are at high risk of being infected with HIV. If you choose to begin PrEP, you should first be tested for HIV. You should then be tested every 3 months for as long as you are taking PrEP.  PREGNANCY   If you are premenopausal and you may become pregnant, ask your health care provider about preconception counseling.  If you may  become pregnant, take 400 to 800 micrograms (mcg) of folic acid every day.  If you want to prevent pregnancy, talk to your health care provider about birth control (contraception). OSTEOPOROSIS AND MENOPAUSE   Osteoporosis is a disease in which the bones lose minerals and strength with aging. This can result in serious bone fractures. Your risk for osteoporosis can be identified using a bone density scan.  If you are 61 years of age or older, or if you are at risk for osteoporosis and fractures, ask your health care provider if you should be screened.  Ask your health care provider whether you should take a calcium or vitamin D supplement to lower your risk for osteoporosis.  Menopause may have certain physical symptoms and risks.  Hormone replacement therapy may reduce some of these symptoms and risks. Talk to your health care provider about whether hormone replacement therapy is right for you.  HOME CARE INSTRUCTIONS   Schedule regular health, dental, and eye exams.  Stay current with your immunizations.   Do not use any tobacco products including cigarettes, chewing tobacco, or electronic cigarettes.  If you are pregnant, do not drink alcohol.  If you are breastfeeding, limit how much and how often you drink alcohol.  Limit alcohol intake to no more than 1 drink per day for nonpregnant women. One drink equals 12 ounces of beer, 5 ounces of wine, or 1 ounces of hard liquor.  Do not use street drugs.  Do not share needles.  Ask your health care provider for help if  you need support or information about quitting drugs.  Tell your health care provider if you often feel depressed.  Tell your health care provider if you have ever been abused or do not feel safe at home.   This information is not intended to replace advice given to you by your health care provider. Make sure you discuss any questions you have with your health care provider.   Document Released: 01/12/2011 Document Revised: 07/20/2014 Document Reviewed: 05/31/2013 Elsevier Interactive Patient Education [employer].

## 2016-01-01 NOTE — Progress Notes (Signed)
Linda Lewis 1971/05/02 [id number]    History:    Presents for annual exam.  Monthly cycles on Nuvaring, current cycle starting today.    Complains of numbness in both hands after working with hands all day, and occasional dizziness.  Normal Pap history and mammogram history.    Past medical history, past surgical history, family history and social history were all reviewed and documented in the EPIC chart.  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  Mother Hypertension.  ROS:  A ROS was performed and pertinent positives and negatives are included.  Exam:  Filed Vitals:   01/01/16 1552  BP: 128/80    General appearance:  Normal, well-appearing. Thyroid:  Symmetrical, normal in size, without palpable masses or nodularity. Respiratory  Auscultation:  Clear without wheezing or rhonchi Cardiovascular  Auscultation:  Regular rate, without rubs, murmurs or gallops  Edema/varicosities:  Not grossly evident Abdominal  Soft,nontender, without masses, guarding or rebound.  Liver/spleen:  No organomegaly noted  Hernia:  None appreciated  Skin  Inspection:  Grossly normal   Breasts: Examined lying and sitting.     Right: Without masses, retractions, discharge or axillary adenopathy.     Left: Without masses, retractions, discharge or axillary adenopathy. Gentitourinary   Inguinal/mons:  Normal without inguinal adenopathy  External genitalia:  Normal  BUS/Urethra/Skene's glands:  Normal  Vagina:  Normal, no erythema or discharge.  Cervix:  Normal  Uterus:  Normal in size, shape and contour.  Midline and mobile  Adnexa/parametria:     Rt: Without masses or tenderness.   Lt: Without masses or tenderness.  Anus and perineum: Normal  Digital rectal exam: Normal sphincter tone without palpated masses or tenderness  Assessment/Plan:  45 y.o. MWF G2P1 for annual exam with no abnormal findings.  Numbness of hands due to occupation Monthly cycle on  NuvaRing  Plan: NuvaRing prescription, proper use given and reviewed slight risk for blood clots and strokes has used without difficulty. SBEs, 3D Mammogram to be scheduled this year, History of dense breasts.    Diflucan 150mg  x1dose prescribed if symptoms of yeast infection occur, history of one yeast infection annually.  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 Motrin, delegate cutting to coworkers as able. Encouraged to maintain healthy, calcium-rich diet, exercise regularly.  Discussed diet, eating 3 meals a day to prevent dizziness.  CBC, TSH. Hemoglobin A1c, UA, No Pap needed per guidelines.    Huel Cote [facility], 4:27 PM 01/01/2016

## 2016-01-02 LAB — HEMOGLOBIN A1C
HEMOGLOBIN A1C: 5.4 % (ref <5.7)
MEAN PLASMA GLUCOSE: 108 mg/dL

## 2016-01-02 LAB — URINALYSIS W MICROSCOPIC + REFLEX CULTURE
Bacteria, UA: NONE SEEN [HPF]
Bilirubin Urine: NEGATIVE
Casts: NONE SEEN [LPF]
Crystals: NONE SEEN [HPF]
Glucose, UA: NEGATIVE
Ketones, ur: NEGATIVE
Leukocytes, UA: NEGATIVE
Nitrite: NEGATIVE
Protein, ur: NEGATIVE
RBC / HPF: NONE SEEN RBC/HPF (ref <=2)
Specific Gravity, Urine: 1.016 (ref 1.001–1.035)
WBC, UA: NONE SEEN WBC/HPF (ref <=5)
Yeast: NONE SEEN [HPF]
pH: 7 (ref 5.0–8.0)

## 2016-01-02 LAB — VITAMIN D 25 HYDROXY (VIT D DEFICIENCY, FRACTURES): Vit D, 25-Hydroxy: 40 ng/mL (ref 30–100)

## 2016-06-15 ENCOUNTER — Other Ambulatory Visit: Payer: Self-pay | Admitting: Women's Health

## 2016-06-15 DIAGNOSIS — Z1231 Encounter for screening mammogram for malignant neoplasm of breast: Secondary | ICD-10-CM

## 2016-07-27 ENCOUNTER — Ambulatory Visit
Admission: RE | Admit: 2016-07-27 | Discharge: 2016-07-27 | Disposition: A | Discharge status: Home / Self Care | Payer: BLUE CROSS/BLUE SHIELD | Source: Ambulatory Visit | Attending: Women's Health | Admitting: Women's Health

## 2016-07-27 DIAGNOSIS — Z1231 Encounter for screening mammogram for malignant neoplasm of breast: Secondary | ICD-10-CM

## 2016-10-29 ENCOUNTER — Telehealth: Payer: Self-pay | Admitting: *Deleted

## 2016-10-29 NOTE — Telephone Encounter (Signed)
Pt called left message in triage voicemail requesting nuavring sample, states no insurance as of now, will have insurance in couple weeks. I called pt and left on voicemail we have 1 nuvaring for pick up.

## 2017-01-04 ENCOUNTER — Encounter: Payer: Self-pay | Admitting: Women's Health

## 2017-01-04 ENCOUNTER — Ambulatory Visit (INDEPENDENT_AMBULATORY_CARE_PROVIDER_SITE_OTHER): Payer: BLUE CROSS/BLUE SHIELD | Admitting: Women's Health

## 2017-01-04 VITALS — BP 104/64 | Ht 65.0 in | Wt 128.6 lb

## 2017-01-04 DIAGNOSIS — Z1151 Encounter for screening for human papillomavirus (HPV): Secondary | ICD-10-CM | POA: Diagnosis not present

## 2017-01-04 DIAGNOSIS — Z3044 Encounter for surveillance of vaginal ring hormonal contraceptive device: Secondary | ICD-10-CM

## 2017-01-04 DIAGNOSIS — Z13220 Encounter for screening for lipoid disorders: Secondary | ICD-10-CM | POA: Diagnosis not present

## 2017-01-04 LAB — CBC WITH DIFFERENTIAL/PLATELET
BASOS ABS: 96 {cells}/uL (ref 0–200)
Basophils Relative: 2 %
Eosinophils Absolute: 48 cells/uL (ref 15–500)
Eosinophils Relative: 1 %
HEMATOCRIT: 41.4 % (ref 35.0–45.0)
HEMOGLOBIN: 13.4 g/dL (ref 11.7–15.5)
LYMPHS ABS: 1344 {cells}/uL (ref 850–3900)
Lymphocytes Relative: 28 %
MCH: 31.2 pg (ref 27.0–33.0)
MCHC: 32.4 g/dL (ref 32.0–36.0)
MCV: 96.3 fL (ref 80.0–100.0)
MONO ABS: 336 {cells}/uL (ref 200–950)
MPV: 10.7 fL (ref 7.5–12.5)
Monocytes Relative: 7 %
NEUTROS ABS: 2976 {cells}/uL (ref 1500–7800)
NEUTROS PCT: 62 %
Platelets: 235 10*3/uL (ref 140–400)
RBC: 4.3 MIL/uL (ref 3.80–5.10)
RDW: 13.1 % (ref 11.0–15.0)
WBC: 4.8 10*3/uL (ref 3.8–10.8)

## 2017-01-04 LAB — COMPREHENSIVE METABOLIC PANEL
ALT: 15 U/L (ref 6–29)
AST: 15 U/L (ref 10–35)
Albumin: 4 g/dL (ref 3.6–5.1)
Alkaline Phosphatase: 72 U/L (ref 33–115)
BILIRUBIN TOTAL: 0.6 mg/dL (ref 0.2–1.2)
BUN: 13 mg/dL (ref 7–25)
CALCIUM: 9.4 mg/dL (ref 8.6–10.2)
CO2: 24 mmol/L (ref 20–31)
CREATININE: 0.77 mg/dL (ref 0.50–1.10)
Chloride: 103 mmol/L (ref 98–110)
Glucose, Bld: 89 mg/dL (ref 65–99)
Potassium: 4.3 mmol/L (ref 3.5–5.3)
SODIUM: 137 mmol/L (ref 135–146)
Total Protein: 6.8 g/dL (ref 6.1–8.1)

## 2017-01-04 LAB — LIPID PANEL
Cholesterol: 177 mg/dL (ref <200)
HDL: 73 mg/dL (ref >50)
LDL Cholesterol: 82 mg/dL (ref <100)
TRIGLYCERIDES: 112 mg/dL (ref <150)
Total CHOL/HDL Ratio: 2.4 Ratio (ref <5.0)
VLDL: 22 mg/dL (ref <30)

## 2017-01-04 MED ORDER — ETONOGESTREL-ETHINYL ESTRADIOL 0.12-0.015 MG/24HR VA RING: VAGINAL_RING | VAGINAL | 4 refills | Status: DC

## 2017-01-04 NOTE — Patient Instructions (Signed)
Dr Carlean Purl  At [facility]  [phone]  colonoscopy  Health Maintenance, Female Adopting a healthy lifestyle and getting preventive care can go a long way to promote health and wellness. Talk with your health care provider about what schedule of regular examinations is right for you. This is a good chance for you to check in with your provider about disease prevention and staying healthy. In between checkups, there are plenty of things you can do on your own. Experts have done a lot of research about which lifestyle changes and preventive measures are most likely to keep you healthy. Ask your health care provider for more information. Weight and diet Eat a healthy diet  Be sure to include plenty of vegetables, fruits, low-fat dairy products, and lean protein.  Do not eat a lot of foods high in solid fats, added sugars, or salt.  Get regular exercise. This is one of the most important things you can do for your health. ? Most adults should exercise for at least 150 minutes each week. The exercise should increase your heart rate and make you sweat (moderate-intensity exercise). ? Most adults should also do strengthening exercises at least twice a week. This is in addition to the moderate-intensity exercise.  Maintain a healthy weight  Body mass index (BMI) is a measurement that can be used to identify possible weight problems. It estimates body fat based on height and weight. Your health care provider can help determine your BMI and help you achieve or maintain a healthy weight.  For females 75 years of age and older: ? A BMI below 18.5 is considered underweight. ? A BMI of 18.5 to 24.9 is normal. ? A BMI of 25 to 29.9 is considered overweight. ? A BMI of 30 and above is considered obese.  Watch levels of cholesterol and blood lipids  You should start having your blood tested for lipids and cholesterol at 47 years of age, then have this test every 5 years.  You may need to have your  cholesterol levels checked more often if: ? Your lipid or cholesterol levels are high. ? You are older than 47 years of age. ? You are at high risk for heart disease.  Cancer screening Lung Cancer  Lung cancer screening is recommended for adults 53-32 years old who are at high risk for lung cancer because of a history of smoking.  A yearly low-dose CT scan of the lungs is recommended for people who: ? Currently smoke. ? Have quit within the past 15 years. ? Have at least a 30-pack-year history of smoking. A pack year is smoking an average of one pack of cigarettes a day for 1 year.  Yearly screening should continue until it has been 15 years since you quit.  Yearly screening should stop if you develop a health problem that would prevent you from having lung cancer treatment.  Breast Cancer  Practice breast self-awareness. This means understanding how your breasts normally appear and feel.  It also means doing regular breast self-exams. Let your health care provider know about any changes, no matter how small.  If you are in your 20s or 30s, you should have a clinical breast exam (CBE) by a health care provider every 1-3 years as part of a regular health exam.  If you are 8 or older, have a CBE every year. Also consider having a breast X-ray (mammogram) every year.  If you have a family history of breast cancer, talk to your health care  provider about genetic screening.  If you are at high risk for breast cancer, talk to your health care provider about having an MRI and a mammogram every year.  Breast cancer gene (BRCA) assessment is recommended for women who have family members with BRCA-related cancers. BRCA-related cancers include: ? Breast. ? Ovarian. ? Tubal. ? Peritoneal cancers.  Results of the assessment will determine the need for genetic counseling and BRCA1 and BRCA2 testing.  Cervical Cancer Your health care provider may recommend that you be screened regularly  for cancer of the pelvic organs (ovaries, uterus, and vagina). This screening involves a pelvic examination, including checking for microscopic changes to the surface of your cervix (Pap test). You may be encouraged to have this screening done every 3 years, beginning at age 21.  For women ages 30-65, health care providers may recommend pelvic exams and Pap testing every 3 years, or they may recommend the Pap and pelvic exam, combined with testing for human papilloma virus (HPV), every 5 years. Some types of HPV increase your risk of cervical cancer. Testing for HPV may also be done on women of any age with unclear Pap test results.  Other health care providers may not recommend any screening for nonpregnant women who are considered low risk for pelvic cancer and who do not have symptoms. Ask your health care provider if a screening pelvic exam is right for you.  If you have had past treatment for cervical cancer or a condition that could lead to cancer, you need Pap tests and screening for cancer for at least 20 years after your treatment. If Pap tests have been discontinued, your risk factors (such as having a new sexual partner) need to be reassessed to determine if screening should resume. Some women have medical problems that increase the chance of getting cervical cancer. In these cases, your health care provider may recommend more frequent screening and Pap tests.  Colorectal Cancer  This type of cancer can be detected and often prevented.  Routine colorectal cancer screening usually begins at 47 years of age and continues through 47 years of age.  Your health care provider may recommend screening at an earlier age if you have risk factors for colon cancer.  Your health care provider may also recommend using home test kits to check for hidden blood in the stool.  A small camera at the end of a tube can be used to examine your colon directly (sigmoidoscopy or colonoscopy). This is done to  check for the earliest forms of colorectal cancer.  Routine screening usually begins at age 50.  Direct examination of the colon should be repeated every 5-10 years through 47 years of age. However, you may need to be screened more often if early forms of precancerous polyps or small growths are found.  Skin Cancer  Check your skin from head to toe regularly.  Tell your health care provider about any new moles or changes in moles, especially if there is a change in a mole's shape or color.  Also tell your health care provider if you have a mole that is larger than the size of a pencil eraser.  Always use sunscreen. Apply sunscreen liberally and repeatedly throughout the day.  Protect yourself by wearing long sleeves, pants, a wide-brimmed hat, and sunglasses whenever you are outside.  Heart disease, diabetes, and high blood pressure  High blood pressure causes heart disease and increases the risk of stroke. High blood pressure is more likely to develop in: ?   People who have blood pressure in the high end of the normal range (130-139/85-89 mm Hg). ? People who are overweight or obese. ? People who are African American.  If you are 18-39 years of age, have your blood pressure checked every 3-5 years. If you are 40 years of age or older, have your blood pressure checked every year. You should have your blood pressure measured twice-once when you are at a hospital or clinic, and once when you are not at a hospital or clinic. Record the average of the two measurements. To check your blood pressure when you are not at a hospital or clinic, you can use: ? An automated blood pressure machine at a pharmacy. ? A home blood pressure monitor.  If you are between 55 years and 79 years old, ask your health care provider if you should take aspirin to prevent strokes.  Have regular diabetes screenings. This involves taking a blood sample to check your fasting blood sugar level. ? If you are at a  normal weight and have a low risk for diabetes, have this test once every three years after 47 years of age. ? If you are overweight and have a high risk for diabetes, consider being tested at a younger age or more often. Preventing infection Hepatitis B  If you have a higher risk for hepatitis B, you should be screened for this virus. You are considered at high risk for hepatitis B if: ? You were born in a country where hepatitis B is common. Ask your health care provider which countries are considered high risk. ? Your parents were born in a high-risk country, and you have not been immunized against hepatitis B (hepatitis B vaccine). ? You have HIV or AIDS. ? You use needles to inject street drugs. ? You live with someone who has hepatitis B. ? You have had sex with someone who has hepatitis B. ? You get hemodialysis treatment. ? You take certain medicines for conditions, including cancer, organ transplantation, and autoimmune conditions.  Hepatitis C  Blood testing is recommended for: ? Everyone born from 1945 through 1965. ? Anyone with known risk factors for hepatitis C.  Sexually transmitted infections (STIs)  You should be screened for sexually transmitted infections (STIs) including gonorrhea and chlamydia if: ? You are sexually active and are younger than 47 years of age. ? You are older than 47 years of age and your health care provider tells you that you are at risk for this type of infection. ? Your sexual activity has changed since you were last screened and you are at an increased risk for chlamydia or gonorrhea. Ask your health care provider if you are at risk.  If you do not have HIV, but are at risk, it may be recommended that you take a prescription medicine daily to prevent HIV infection. This is called pre-exposure prophylaxis (PrEP). You are considered at risk if: ? You are sexually active and do not regularly use condoms or know the HIV status of your  partner(s). ? You take drugs by injection. ? You are sexually active with a partner who has HIV.  Talk with your health care provider about whether you are at high risk of being infected with HIV. If you choose to begin PrEP, you should first be tested for HIV. You should then be tested every 3 months for as long as you are taking PrEP. Pregnancy  If you are premenopausal and you may become pregnant, ask your health   care provider about preconception counseling.  If you may become pregnant, take 400 to 800 micrograms (mcg) of folic acid every day.  If you want to prevent pregnancy, talk to your health care provider about birth control (contraception). Osteoporosis and menopause  Osteoporosis is a disease in which the bones lose minerals and strength with aging. This can result in serious bone fractures. Your risk for osteoporosis can be identified using a bone density scan.  If you are 65 years of age or older, or if you are at risk for osteoporosis and fractures, ask your health care provider if you should be screened.  Ask your health care provider whether you should take a calcium or vitamin D supplement to lower your risk for osteoporosis.  Menopause may have certain physical symptoms and risks.  Hormone replacement therapy may reduce some of these symptoms and risks. Talk to your health care provider about whether hormone replacement therapy is right for you. Follow these instructions at home:  Schedule regular health, dental, and eye exams.  Stay current with your immunizations.  Do not use any tobacco products including cigarettes, chewing tobacco, or electronic cigarettes.  If you are pregnant, do not drink alcohol.  If you are breastfeeding, limit how much and how often you drink alcohol.  Limit alcohol intake to no more than 1 drink per day for nonpregnant women. One drink equals 12 ounces of beer, 5 ounces of wine, or 1 ounces of hard liquor.  Do not use street  drugs.  Do not share needles.  Ask your health care provider for help if you need support or information about quitting drugs.  Tell your health care provider if you often feel depressed.  Tell your health care provider if you have ever been abused or do not feel safe at home. This information is not intended to replace advice given to you by your health care provider. Make sure you discuss any questions you have with your health care provider. Document Released: 01/12/2011 Document Revised: 12/05/2015 Document Reviewed: 04/02/2015 Elsevier Interactive Patient Education  2018 [employer]  

## 2017-01-04 NOTE — Progress Notes (Signed)
Linda Lewis Oct 31, 1970 [id number]    History:    Presents for annual exam.  Monthly cycle on NuvaRing, reports starting to have occasional hot flushes. Normal Pap and mammogram history.  Past medical history, past surgical history, family history and social history were all reviewed and documented in the EPIC chart. 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  ROS:  A ROS was performed and pertinent positives and negatives are included.  Exam:  Vitals:   01/04/17 1031  BP: 104/64  Weight: 128 lb 9.6 oz (58.3 kg)  Height: 5\' 5"  (1.651 m)   Body mass index is 21.4 kg/m.   General appearance:  Normal Thyroid:  Symmetrical, normal in size, without palpable masses or nodularity. Respiratory  Auscultation:  Clear without wheezing or rhonchi Cardiovascular  Auscultation:  Regular rate, without rubs, murmurs or gallops  Edema/varicosities:  Not grossly evident Abdominal  Soft,nontender, without masses, guarding or rebound.  Liver/spleen:  No organomegaly noted  Hernia:  None appreciated  Skin  Inspection:  Grossly normal   Breasts: Examined lying and sitting.     Right: Without masses, retractions, discharge or axillary adenopathy.     Left: Without masses, retractions, discharge or axillary adenopathy. Gentitourinary   Inguinal/mons:  Normal without inguinal adenopathy  External genitalia:  Normal  BUS/Urethra/Skene's glands:  Normal  Vagina:  Normal  Cervix:  Normal  Uterus:   normal in size, shape and contour.  Midline and mobile  Adnexa/parametria:     Rt: Without masses or tenderness.   Lt: Without masses or tenderness.  Anus and perineum: Normal  Digital rectal exam: Normal sphincter tone without palpated masses or tenderness  Assessment/Plan:  46 y.o. MWF G2 P1  for annual exam with no complaints.  Monthly cycle on NuvaRing, occasional hot flushes  Plan: NuvaRing prescription, proper use, slight risk for blood clots and strokes reviewed. SBE's, continue annual  screening mammogram, exercise, calcium rich diet, vitamin D 2000 daily encouraged. CBC, lipid panel, CMP, Pap with HR HPV typing, new screening guidelines reviewed.  [facility], 1:21 PM 01/04/2017

## 2017-01-07 LAB — PAP, TP IMAGING W/ HPV RNA, RFLX HPV TYPE 16,18/45: HPV mRNA, High Risk: NOT DETECTED

## 2017-02-18 DIAGNOSIS — M5414 Radiculopathy, thoracic region: Secondary | ICD-10-CM | POA: Diagnosis not present

## 2017-02-18 DIAGNOSIS — M9902 Segmental and somatic dysfunction of thoracic region: Secondary | ICD-10-CM | POA: Diagnosis not present

## 2017-02-18 DIAGNOSIS — M9901 Segmental and somatic dysfunction of cervical region: Secondary | ICD-10-CM | POA: Diagnosis not present

## 2017-02-18 DIAGNOSIS — M531 Cervicobrachial syndrome: Secondary | ICD-10-CM | POA: Diagnosis not present

## 2017-03-03 DIAGNOSIS — M5414 Radiculopathy, thoracic region: Secondary | ICD-10-CM | POA: Diagnosis not present

## 2017-03-03 DIAGNOSIS — M9901 Segmental and somatic dysfunction of cervical region: Secondary | ICD-10-CM | POA: Diagnosis not present

## 2017-03-03 DIAGNOSIS — M531 Cervicobrachial syndrome: Secondary | ICD-10-CM | POA: Diagnosis not present

## 2017-03-03 DIAGNOSIS — M9902 Segmental and somatic dysfunction of thoracic region: Secondary | ICD-10-CM | POA: Diagnosis not present

## 2017-03-12 DIAGNOSIS — M531 Cervicobrachial syndrome: Secondary | ICD-10-CM | POA: Diagnosis not present

## 2017-03-12 DIAGNOSIS — M9901 Segmental and somatic dysfunction of cervical region: Secondary | ICD-10-CM | POA: Diagnosis not present

## 2017-03-12 DIAGNOSIS — M5414 Radiculopathy, thoracic region: Secondary | ICD-10-CM | POA: Diagnosis not present

## 2017-03-12 DIAGNOSIS — M9902 Segmental and somatic dysfunction of thoracic region: Secondary | ICD-10-CM | POA: Diagnosis not present

## 2017-03-25 DIAGNOSIS — M5414 Radiculopathy, thoracic region: Secondary | ICD-10-CM | POA: Diagnosis not present

## 2017-03-25 DIAGNOSIS — M9901 Segmental and somatic dysfunction of cervical region: Secondary | ICD-10-CM | POA: Diagnosis not present

## 2017-03-25 DIAGNOSIS — M9902 Segmental and somatic dysfunction of thoracic region: Secondary | ICD-10-CM | POA: Diagnosis not present

## 2017-03-25 DIAGNOSIS — M531 Cervicobrachial syndrome: Secondary | ICD-10-CM | POA: Diagnosis not present

## 2017-04-15 DIAGNOSIS — M531 Cervicobrachial syndrome: Secondary | ICD-10-CM | POA: Diagnosis not present

## 2017-04-15 DIAGNOSIS — M9901 Segmental and somatic dysfunction of cervical region: Secondary | ICD-10-CM | POA: Diagnosis not present

## 2017-04-15 DIAGNOSIS — M5414 Radiculopathy, thoracic region: Secondary | ICD-10-CM | POA: Diagnosis not present

## 2017-04-15 DIAGNOSIS — M9902 Segmental and somatic dysfunction of thoracic region: Secondary | ICD-10-CM | POA: Diagnosis not present

## 2017-05-04 DIAGNOSIS — L814 Other melanin hyperpigmentation: Secondary | ICD-10-CM | POA: Diagnosis not present

## 2017-05-04 DIAGNOSIS — L503 Dermatographic urticaria: Secondary | ICD-10-CM | POA: Diagnosis not present

## 2017-05-19 DIAGNOSIS — M9902 Segmental and somatic dysfunction of thoracic region: Secondary | ICD-10-CM | POA: Diagnosis not present

## 2017-05-19 DIAGNOSIS — M531 Cervicobrachial syndrome: Secondary | ICD-10-CM | POA: Diagnosis not present

## 2017-05-19 DIAGNOSIS — M9901 Segmental and somatic dysfunction of cervical region: Secondary | ICD-10-CM | POA: Diagnosis not present

## 2017-05-19 DIAGNOSIS — M5414 Radiculopathy, thoracic region: Secondary | ICD-10-CM | POA: Diagnosis not present

## 2017-06-09 DIAGNOSIS — M9901 Segmental and somatic dysfunction of cervical region: Secondary | ICD-10-CM | POA: Diagnosis not present

## 2017-06-09 DIAGNOSIS — M9902 Segmental and somatic dysfunction of thoracic region: Secondary | ICD-10-CM | POA: Diagnosis not present

## 2017-06-09 DIAGNOSIS — M531 Cervicobrachial syndrome: Secondary | ICD-10-CM | POA: Diagnosis not present

## 2017-06-09 DIAGNOSIS — M5414 Radiculopathy, thoracic region: Secondary | ICD-10-CM | POA: Diagnosis not present

## 2017-06-29 DIAGNOSIS — J019 Acute sinusitis, unspecified: Secondary | ICD-10-CM | POA: Diagnosis not present

## 2017-06-30 DIAGNOSIS — M9901 Segmental and somatic dysfunction of cervical region: Secondary | ICD-10-CM | POA: Diagnosis not present

## 2017-06-30 DIAGNOSIS — M9902 Segmental and somatic dysfunction of thoracic region: Secondary | ICD-10-CM | POA: Diagnosis not present

## 2017-06-30 DIAGNOSIS — M5414 Radiculopathy, thoracic region: Secondary | ICD-10-CM | POA: Diagnosis not present

## 2017-06-30 DIAGNOSIS — M531 Cervicobrachial syndrome: Secondary | ICD-10-CM | POA: Diagnosis not present

## 2017-07-15 ENCOUNTER — Other Ambulatory Visit: Payer: Self-pay | Admitting: Women's Health

## 2017-07-15 DIAGNOSIS — M5414 Radiculopathy, thoracic region: Secondary | ICD-10-CM | POA: Diagnosis not present

## 2017-07-15 DIAGNOSIS — Z1231 Encounter for screening mammogram for malignant neoplasm of breast: Secondary | ICD-10-CM

## 2017-07-15 DIAGNOSIS — M9901 Segmental and somatic dysfunction of cervical region: Secondary | ICD-10-CM | POA: Diagnosis not present

## 2017-07-15 DIAGNOSIS — M531 Cervicobrachial syndrome: Secondary | ICD-10-CM | POA: Diagnosis not present

## 2017-07-15 DIAGNOSIS — M9902 Segmental and somatic dysfunction of thoracic region: Secondary | ICD-10-CM | POA: Diagnosis not present

## 2017-07-26 DIAGNOSIS — I8312 Varicose veins of left lower extremity with inflammation: Secondary | ICD-10-CM | POA: Diagnosis not present

## 2017-08-02 DIAGNOSIS — I8312 Varicose veins of left lower extremity with inflammation: Secondary | ICD-10-CM | POA: Diagnosis not present

## 2017-08-09 ENCOUNTER — Ambulatory Visit
Admission: RE | Admit: 2017-08-09 | Discharge: 2017-08-09 | Disposition: A | Discharge status: Home / Self Care | Payer: BLUE CROSS/BLUE SHIELD | Source: Ambulatory Visit | Attending: Women's Health | Admitting: Women's Health

## 2017-08-09 DIAGNOSIS — Z1231 Encounter for screening mammogram for malignant neoplasm of breast: Secondary | ICD-10-CM

## 2017-08-30 DIAGNOSIS — M5414 Radiculopathy, thoracic region: Secondary | ICD-10-CM | POA: Diagnosis not present

## 2017-08-30 DIAGNOSIS — M79605 Pain in left leg: Secondary | ICD-10-CM | POA: Diagnosis not present

## 2017-08-30 DIAGNOSIS — M9901 Segmental and somatic dysfunction of cervical region: Secondary | ICD-10-CM | POA: Diagnosis not present

## 2017-08-30 DIAGNOSIS — M9902 Segmental and somatic dysfunction of thoracic region: Secondary | ICD-10-CM | POA: Diagnosis not present

## 2017-08-30 DIAGNOSIS — M531 Cervicobrachial syndrome: Secondary | ICD-10-CM | POA: Diagnosis not present

## 2017-08-30 DIAGNOSIS — M79604 Pain in right leg: Secondary | ICD-10-CM | POA: Diagnosis not present

## 2017-10-11 DIAGNOSIS — M9902 Segmental and somatic dysfunction of thoracic region: Secondary | ICD-10-CM | POA: Diagnosis not present

## 2017-10-11 DIAGNOSIS — M9903 Segmental and somatic dysfunction of lumbar region: Secondary | ICD-10-CM | POA: Diagnosis not present

## 2017-10-11 DIAGNOSIS — M9901 Segmental and somatic dysfunction of cervical region: Secondary | ICD-10-CM | POA: Diagnosis not present

## 2017-12-27 DIAGNOSIS — M9902 Segmental and somatic dysfunction of thoracic region: Secondary | ICD-10-CM | POA: Diagnosis not present

## 2017-12-27 DIAGNOSIS — M9903 Segmental and somatic dysfunction of lumbar region: Secondary | ICD-10-CM | POA: Diagnosis not present

## 2017-12-27 DIAGNOSIS — M9901 Segmental and somatic dysfunction of cervical region: Secondary | ICD-10-CM | POA: Diagnosis not present

## 2018-01-17 ENCOUNTER — Encounter: Payer: Self-pay | Admitting: Women's Health

## 2018-01-17 ENCOUNTER — Ambulatory Visit (INDEPENDENT_AMBULATORY_CARE_PROVIDER_SITE_OTHER): Payer: BLUE CROSS/BLUE SHIELD | Admitting: Women's Health

## 2018-01-17 VITALS — BP 120/82 | Ht 64.0 in | Wt 115.0 lb

## 2018-01-17 DIAGNOSIS — Z01419 Encounter for gynecological examination (general) (routine) without abnormal findings: Secondary | ICD-10-CM

## 2018-01-17 DIAGNOSIS — Z3044 Encounter for surveillance of vaginal ring hormonal contraceptive device: Secondary | ICD-10-CM | POA: Diagnosis not present

## 2018-01-17 DIAGNOSIS — R350 Frequency of micturition: Secondary | ICD-10-CM

## 2018-01-17 LAB — CBC WITH DIFFERENTIAL/PLATELET
BASOS ABS: 42 {cells}/uL (ref 0–200)
Basophils Relative: 1.1 %
EOS PCT: 1.8 %
Eosinophils Absolute: 68 cells/uL (ref 15–500)
HEMATOCRIT: 41 % (ref 35.0–45.0)
Hemoglobin: 13.6 g/dL (ref 11.7–15.5)
LYMPHS ABS: 1098 {cells}/uL (ref 850–3900)
MCH: 31.7 pg (ref 27.0–33.0)
MCHC: 33.2 g/dL (ref 32.0–36.0)
MCV: 95.6 fL (ref 80.0–100.0)
MONOS PCT: 7.6 %
MPV: 11.5 fL (ref 7.5–12.5)
NEUTROS PCT: 60.6 %
Neutro Abs: 2303 cells/uL (ref 1500–7800)
PLATELETS: 207 10*3/uL (ref 140–400)
RBC: 4.29 10*6/uL (ref 3.80–5.10)
RDW: 11.6 % (ref 11.0–15.0)
TOTAL LYMPHOCYTE: 28.9 %
WBC mixed population: 289 cells/uL (ref 200–950)
WBC: 3.8 10*3/uL (ref 3.8–10.8)

## 2018-01-17 LAB — COMPREHENSIVE METABOLIC PANEL
AG Ratio: 1.5 (calc) (ref 1.0–2.5)
ALBUMIN MSPROF: 4 g/dL (ref 3.6–5.1)
ALT: 15 U/L (ref 6–29)
AST: 13 U/L (ref 10–35)
Alkaline phosphatase (APISO): 69 U/L (ref 33–115)
BUN: 14 mg/dL (ref 7–25)
CALCIUM: 9.2 mg/dL (ref 8.6–10.2)
CO2: 24 mmol/L (ref 20–32)
CREATININE: 0.76 mg/dL (ref 0.50–1.10)
Chloride: 104 mmol/L (ref 98–110)
GLUCOSE: 104 mg/dL — AB (ref 65–99)
Globulin: 2.7 g/dL (calc) (ref 1.9–3.7)
POTASSIUM: 4.6 mmol/L (ref 3.5–5.3)
SODIUM: 138 mmol/L (ref 135–146)
TOTAL PROTEIN: 6.7 g/dL (ref 6.1–8.1)
Total Bilirubin: 0.5 mg/dL (ref 0.2–1.2)

## 2018-01-17 MED ORDER — ETONOGESTREL-ETHINYL ESTRADIOL 0.12-0.015 MG/24HR VA RING: VAGINAL_RING | VAGINAL | 4 refills | Status: DC

## 2018-01-17 NOTE — Progress Notes (Signed)
Linda Lewis 04-08-1971 [id number]    History:    Presents for annual exam.  Light monthly cycle on NuvaRing but numerous hot flushes and poor sleep week she is off nuva ring.  Normal Pap and mammogram history.  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   Past medical history, past surgical history, family history and social history were all reviewed and documented in the EPIC chart.  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  ROS:  A ROS was performed and pertinent positives and negatives are included.  Exam:  Vitals:   01/17/18 1011  BP: 120/82  Weight: 115 lb (52.2 kg)  Height: 5\' 4"  (1.626 m)   Body mass index is 19.74 kg/m.   General appearance:  Normal Thyroid:  Symmetrical, normal in size, without palpable masses or nodularity. Respiratory  Auscultation:  Clear without wheezing or rhonchi Cardiovascular  Auscultation:  Regular rate, without rubs, murmurs or gallops  Edema/varicosities:  Not grossly evident Abdominal  Soft,nontender, without masses, guarding or rebound.  Liver/spleen:  No organomegaly noted  Hernia:  None appreciated  Skin  Inspection:  Grossly normal   Breasts: Examined lying and sitting.     Right: Without masses, retractions, discharge or axillary adenopathy.     Left: Without masses, retractions, discharge or axillary adenopathy. Gentitourinary   Inguinal/mons:  Normal without inguinal adenopathy  External genitalia:  Normal  BUS/Urethra/Skene's glands:  Normal  Vagina:  Normal  Cervix:  Normal  Uterus: normal in size, shape and contour.  Midline and mobile  Adnexa/parametria:     Rt: Without masses or tenderness.   Lt: Without masses or tenderness.  Anus and perineum: Normal  Digital rectal exam: Normal sphincter tone without palpated masses or tenderness  Assessment/Plan:  47 y.o. MWF G2, P1 for annual exam increased urinary frequency without pain or  burning.  Light monthly cycle on NuvaRing with increased hot flushes week off NuvaRing  Plan: NuvaRing prescription, proper use, slight risk for blood clots and strokes reviewed, will use continuously keep in 28 days and replace.  SBE's, continue annual screening mammogram, calcium rich foods, continue same MVI, normal vitamin D in the past.  CBC, CMP.  Lipid panel good 1 year ago, normal Pap 2018, new screening guidelines reviewed.  UA pending  [place], 10:41 AM 01/17/2018

## 2018-01-17 NOTE — Patient Instructions (Addendum)
Health Maintenance for Postmenopausal Women Menopause is a normal process in which your reproductive ability comes to an end. This process happens gradually over a span of months to years, usually between the ages of 22 and 9. Menopause is complete when you have missed 12 consecutive menstrual periods. It is important to talk with your health care provider about some of the most common conditions that affect postmenopausal women, such as heart disease, cancer, and bone loss (osteoporosis). Adopting a healthy lifestyle and getting preventive care can help to promote your health and wellness. Those actions can also lower your chances of developing some of these common conditions. What should I know about menopause? During menopause, you may experience a number of symptoms, such as:  Moderate-to-severe hot flashes.  Night sweats.  Decrease in sex drive.  Mood swings.  Headaches.  Tiredness.  Irritability.  Memory problems.  Insomnia.  Choosing to treat or not to treat menopausal changes is an individual decision that you make with your health care provider. What should I know about hormone replacement therapy and supplements? Hormone therapy products are effective for treating symptoms that are associated with menopause, such as hot flashes and night sweats. Hormone replacement carries certain risks, especially as you become older. If you are thinking about using estrogen or estrogen with progestin treatments, discuss the benefits and risks with your health care provider. What should I know about heart disease and stroke? Heart disease, heart attack, and stroke become more likely as you age. This may be due, in part, to the hormonal changes that your body experiences during menopause. These can affect how your body processes dietary fats, triglycerides, and cholesterol. Heart attack and stroke are both medical emergencies. There are many things that you can do to help prevent heart disease  and stroke:  Have your blood pressure checked at least every 1-2 years. High blood pressure causes heart disease and increases the risk of stroke.  If you are 53-22 years old, ask your health care provider if you should take aspirin to prevent a heart attack or a stroke.  Do not use any tobacco products, including cigarettes, chewing tobacco, or electronic cigarettes. If you need help quitting, ask your health care provider.  It is important to eat a healthy diet and maintain a healthy weight. ? Be sure to include plenty of vegetables, fruits, low-fat dairy products, and lean protein. ? Avoid eating foods that are high in solid fats, added sugars, or salt (sodium).  Get regular exercise. This is one of the most important things that you can do for your health. ? Try to exercise for at least 150 minutes each week. The type of exercise that you do should increase your heart rate and make you sweat. This is known as moderate-intensity exercise. ? Try to do strengthening exercises at least twice each week. Do these in addition to the moderate-intensity exercise.  Know your numbers.Ask your health care provider to check your cholesterol and your blood glucose. Continue to have your blood tested as directed by your health care provider.  What should I know about cancer screening? There are several types of cancer. Take the following steps to reduce your risk and to catch any cancer development as early as possible. Breast Cancer  Practice breast self-awareness. ? This means understanding how your breasts normally appear and feel. ? It also means doing regular breast self-exams. Let your health care provider know about any changes, no matter how small.  If you are 40  or older, have a clinician do a breast exam (clinical breast exam or CBE) every year. Depending on your age, family history, and medical history, it may be recommended that you also have a yearly breast X-ray (mammogram).  If you  have a family history of breast cancer, talk with your health care provider about genetic screening.  If you are at high risk for breast cancer, talk with your health care provider about having an MRI and a mammogram every year.  Breast cancer (BRCA) gene test is recommended for women who have family members with BRCA-related cancers. Results of the assessment will determine the need for genetic counseling and BRCA1 and for BRCA2 testing. BRCA-related cancers include these types: ? Breast. This occurs in males or females. ? Ovarian. ? Tubal. This may also be called fallopian tube cancer. ? Cancer of the abdominal or pelvic lining (peritoneal cancer). ? Prostate. ? Pancreatic.  Cervical, Uterine, and Ovarian Cancer Your health care provider may recommend that you be screened regularly for cancer of the pelvic organs. These include your ovaries, uterus, and vagina. This screening involves a pelvic exam, which includes checking for microscopic changes to the surface of your cervix (Pap test).  For women ages 21-65, health care providers may recommend a pelvic exam and a Pap test every three years. For women ages 79-65, they may recommend the Pap test and pelvic exam, combined with testing for human papilloma virus (HPV), every five years. Some types of HPV increase your risk of cervical cancer. Testing for HPV may also be done on women of any age who have unclear Pap test results.  Other health care providers may not recommend any screening for nonpregnant women who are considered low risk for pelvic cancer and have no symptoms. Ask your health care provider if a screening pelvic exam is right for you.  If you have had past treatment for cervical cancer or a condition that could lead to cancer, you need Pap tests and screening for cancer for at least 20 years after your treatment. If Pap tests have been discontinued for you, your risk factors (such as having a new sexual partner) need to be  reassessed to determine if you should start having screenings again. Some women have medical problems that increase the chance of getting cervical cancer. In these cases, your health care provider may recommend that you have screening and Pap tests more often.  If you have a family history of uterine cancer or ovarian cancer, talk with your health care provider about genetic screening.  If you have vaginal bleeding after reaching menopause, tell your health care provider.  There are currently no reliable tests available to screen for ovarian cancer.  Lung Cancer Lung cancer screening is recommended for adults 69-62 years old who are at high risk for lung cancer because of a history of smoking. A yearly low-dose CT scan of the lungs is recommended if you:  Currently smoke.  Have a history of at least 30 pack-years of smoking and you currently smoke or have quit within the past 15 years. A pack-year is smoking an average of one pack of cigarettes per day for one year.  Yearly screening should:  Continue until it has been 15 years since you quit.  Stop if you develop a health problem that would prevent you from having lung cancer treatment.  Colorectal Cancer  This type of cancer can be detected and can often be prevented.  Routine colorectal cancer screening usually begins at  age 42 and continues through age 45.  If you have risk factors for colon cancer, your health care provider may recommend that you be screened at an earlier age.  If you have a family history of colorectal cancer, talk with your health care provider about genetic screening.  Your health care provider may also recommend using home test kits to check for hidden blood in your stool.  A small camera at the end of a tube can be used to examine your colon directly (sigmoidoscopy or colonoscopy). This is done to check for the earliest forms of colorectal cancer.  Direct examination of the colon should be repeated every  5-10 years until age 71. However, if early forms of precancerous polyps or small growths are found or if you have a family history or genetic risk for colorectal cancer, you may need to be screened more often.  Skin Cancer  Check your skin from head to toe regularly.  Monitor any moles. Be sure to tell your health care provider: ? About any new moles or changes in moles, especially if there is a change in a mole's shape or color. ? If you have a mole that is larger than the size of a pencil eraser.  If any of your family members has a history of skin cancer, especially at a young age, talk with your health care provider about genetic screening.  Always use sunscreen. Apply sunscreen liberally and repeatedly throughout the day.  Whenever you are outside, protect yourself by wearing long sleeves, pants, a wide-brimmed hat, and sunglasses.  What should I know about osteoporosis? Osteoporosis is a condition in which bone destruction happens more quickly than new bone creation. After menopause, you may be at an increased risk for osteoporosis. To help prevent osteoporosis or the bone fractures that can happen because of osteoporosis, the following is recommended:  If you are 46-71 years old, get at least 1,000 mg of calcium and at least 600 mg of vitamin D per day.  If you are older than age 55 but younger than age 65, get at least 1,200 mg of calcium and at least 600 mg of vitamin D per day.  If you are older than age 54, get at least 1,200 mg of calcium and at least 800 mg of vitamin D per day.  Smoking and excessive alcohol intake increase the risk of osteoporosis. Eat foods that are rich in calcium and vitamin D, and do weight-bearing exercises several times each week as directed by your health care provider. What should I know about how menopause affects my mental health? Depression may occur at any age, but it is more common as you become older. Common symptoms of depression  include:  Low or sad mood.  Changes in sleep patterns.  Changes in appetite or eating patterns.  Feeling an overall lack of motivation or enjoyment of activities that you previously enjoyed.  Frequent crying spells.  Talk with your health care provider if you think that you are experiencing depression. What should I know about immunizations? It is important that you get and maintain your immunizations. These include:  Tetanus, diphtheria, and pertussis (Tdap) booster vaccine.  Influenza every year before the flu season begins.  Pneumonia vaccine.  Shingles vaccine.  Your health care provider may also recommend other immunizations. This information is not intended to replace advice given to you by your health care provider. Make sure you discuss any questions you have with your health care provider. Document Released: 08/21/2005  Document Revised: 01/17/2016 Document Reviewed: 04/02/2015 Elsevier Interactive Patient Education  2018 Elsevier Inc.  

## 2018-01-18 LAB — URINALYSIS, COMPLETE W/RFL CULTURE
BACTERIA UA: NONE SEEN /HPF
Bilirubin Urine: NEGATIVE
Glucose, UA: NEGATIVE
HGB URINE DIPSTICK: NEGATIVE
HYALINE CAST: NONE SEEN /LPF
KETONES UR: NEGATIVE
LEUKOCYTE ESTERASE: NEGATIVE
Nitrites, Initial: NEGATIVE
Protein, ur: NEGATIVE
RBC / HPF: NONE SEEN /HPF (ref 0–2)
SPECIFIC GRAVITY, URINE: 1.012 (ref 1.001–1.03)
SQUAMOUS EPITHELIAL / LPF: NONE SEEN /HPF (ref <=5)
WBC, UA: NONE SEEN /HPF (ref 0–5)
pH: 7.5 (ref 5.0–8.0)

## 2018-01-18 LAB — NO CULTURE INDICATED

## 2018-01-26 DIAGNOSIS — M9903 Segmental and somatic dysfunction of lumbar region: Secondary | ICD-10-CM | POA: Diagnosis not present

## 2018-01-26 DIAGNOSIS — M9902 Segmental and somatic dysfunction of thoracic region: Secondary | ICD-10-CM | POA: Diagnosis not present

## 2018-01-26 DIAGNOSIS — M9901 Segmental and somatic dysfunction of cervical region: Secondary | ICD-10-CM | POA: Diagnosis not present

## 2018-02-27 ENCOUNTER — Other Ambulatory Visit: Payer: Self-pay | Admitting: Women's Health

## 2018-02-27 DIAGNOSIS — Z3044 Encounter for surveillance of vaginal ring hormonal contraceptive device: Secondary | ICD-10-CM

## 2018-03-09 DIAGNOSIS — M9901 Segmental and somatic dysfunction of cervical region: Secondary | ICD-10-CM | POA: Diagnosis not present

## 2018-03-09 DIAGNOSIS — M9902 Segmental and somatic dysfunction of thoracic region: Secondary | ICD-10-CM | POA: Diagnosis not present

## 2018-03-09 DIAGNOSIS — M9903 Segmental and somatic dysfunction of lumbar region: Secondary | ICD-10-CM | POA: Diagnosis not present

## 2018-03-23 DIAGNOSIS — M9907 Segmental and somatic dysfunction of upper extremity: Secondary | ICD-10-CM | POA: Diagnosis not present

## 2018-03-23 DIAGNOSIS — M9903 Segmental and somatic dysfunction of lumbar region: Secondary | ICD-10-CM | POA: Diagnosis not present

## 2018-03-23 DIAGNOSIS — M9901 Segmental and somatic dysfunction of cervical region: Secondary | ICD-10-CM | POA: Diagnosis not present

## 2018-03-23 DIAGNOSIS — M9902 Segmental and somatic dysfunction of thoracic region: Secondary | ICD-10-CM | POA: Diagnosis not present

## 2018-04-20 DIAGNOSIS — M9907 Segmental and somatic dysfunction of upper extremity: Secondary | ICD-10-CM | POA: Diagnosis not present

## 2018-04-20 DIAGNOSIS — M9901 Segmental and somatic dysfunction of cervical region: Secondary | ICD-10-CM | POA: Diagnosis not present

## 2018-04-20 DIAGNOSIS — M9902 Segmental and somatic dysfunction of thoracic region: Secondary | ICD-10-CM | POA: Diagnosis not present

## 2018-04-20 DIAGNOSIS — M9903 Segmental and somatic dysfunction of lumbar region: Secondary | ICD-10-CM | POA: Diagnosis not present

## 2018-05-26 DIAGNOSIS — M9903 Segmental and somatic dysfunction of lumbar region: Secondary | ICD-10-CM | POA: Diagnosis not present

## 2018-05-26 DIAGNOSIS — M9902 Segmental and somatic dysfunction of thoracic region: Secondary | ICD-10-CM | POA: Diagnosis not present

## 2018-05-26 DIAGNOSIS — M9907 Segmental and somatic dysfunction of upper extremity: Secondary | ICD-10-CM | POA: Diagnosis not present

## 2018-05-26 DIAGNOSIS — M9901 Segmental and somatic dysfunction of cervical region: Secondary | ICD-10-CM | POA: Diagnosis not present

## 2018-07-14 ENCOUNTER — Other Ambulatory Visit: Payer: Self-pay | Admitting: Women's Health

## 2018-07-14 DIAGNOSIS — Z1231 Encounter for screening mammogram for malignant neoplasm of breast: Secondary | ICD-10-CM

## 2018-07-15 DIAGNOSIS — M9903 Segmental and somatic dysfunction of lumbar region: Secondary | ICD-10-CM | POA: Diagnosis not present

## 2018-07-15 DIAGNOSIS — M9901 Segmental and somatic dysfunction of cervical region: Secondary | ICD-10-CM | POA: Diagnosis not present

## 2018-07-15 DIAGNOSIS — M9902 Segmental and somatic dysfunction of thoracic region: Secondary | ICD-10-CM | POA: Diagnosis not present

## 2018-07-19 DIAGNOSIS — R079 Chest pain, unspecified: Secondary | ICD-10-CM | POA: Diagnosis not present

## 2018-07-19 DIAGNOSIS — I10 Essential (primary) hypertension: Secondary | ICD-10-CM | POA: Diagnosis not present

## 2018-07-19 DIAGNOSIS — R072 Precordial pain: Secondary | ICD-10-CM | POA: Diagnosis not present

## 2018-07-20 DIAGNOSIS — R079 Chest pain, unspecified: Secondary | ICD-10-CM | POA: Diagnosis not present

## 2018-08-15 ENCOUNTER — Ambulatory Visit
Admission: RE | Admit: 2018-08-15 | Discharge: 2018-08-15 | Disposition: A | Discharge status: Home / Self Care | Payer: BLUE CROSS/BLUE SHIELD | Source: Ambulatory Visit | Attending: Women's Health | Admitting: Women's Health

## 2018-08-15 DIAGNOSIS — Z1231 Encounter for screening mammogram for malignant neoplasm of breast: Secondary | ICD-10-CM | POA: Diagnosis not present

## 2018-08-31 DIAGNOSIS — M9901 Segmental and somatic dysfunction of cervical region: Secondary | ICD-10-CM | POA: Diagnosis not present

## 2018-08-31 DIAGNOSIS — M9903 Segmental and somatic dysfunction of lumbar region: Secondary | ICD-10-CM | POA: Diagnosis not present

## 2018-08-31 DIAGNOSIS — M9902 Segmental and somatic dysfunction of thoracic region: Secondary | ICD-10-CM | POA: Diagnosis not present

## 2018-09-22 DIAGNOSIS — M9903 Segmental and somatic dysfunction of lumbar region: Secondary | ICD-10-CM | POA: Diagnosis not present

## 2018-09-22 DIAGNOSIS — M9902 Segmental and somatic dysfunction of thoracic region: Secondary | ICD-10-CM | POA: Diagnosis not present

## 2018-09-22 DIAGNOSIS — M9901 Segmental and somatic dysfunction of cervical region: Secondary | ICD-10-CM | POA: Diagnosis not present

## 2018-11-14 DIAGNOSIS — M9902 Segmental and somatic dysfunction of thoracic region: Secondary | ICD-10-CM | POA: Diagnosis not present

## 2018-11-14 DIAGNOSIS — M9901 Segmental and somatic dysfunction of cervical region: Secondary | ICD-10-CM | POA: Diagnosis not present

## 2018-11-14 DIAGNOSIS — M9903 Segmental and somatic dysfunction of lumbar region: Secondary | ICD-10-CM | POA: Diagnosis not present

## 2019-01-20 ENCOUNTER — Other Ambulatory Visit: Payer: Self-pay

## 2019-01-23 ENCOUNTER — Encounter: Payer: Self-pay | Admitting: Women's Health

## 2019-01-23 ENCOUNTER — Ambulatory Visit (INDEPENDENT_AMBULATORY_CARE_PROVIDER_SITE_OTHER): Payer: BC Managed Care – PPO | Admitting: Women's Health

## 2019-01-23 ENCOUNTER — Other Ambulatory Visit: Payer: Self-pay

## 2019-01-23 VITALS — BP 122/78 | Ht 64.0 in | Wt 121.0 lb

## 2019-01-23 DIAGNOSIS — Z01419 Encounter for gynecological examination (general) (routine) without abnormal findings: Secondary | ICD-10-CM | POA: Diagnosis not present

## 2019-01-23 DIAGNOSIS — R635 Abnormal weight gain: Secondary | ICD-10-CM

## 2019-01-23 DIAGNOSIS — Z3044 Encounter for surveillance of vaginal ring hormonal contraceptive device: Secondary | ICD-10-CM | POA: Diagnosis not present

## 2019-01-23 DIAGNOSIS — R350 Frequency of micturition: Secondary | ICD-10-CM

## 2019-01-23 MED ORDER — ETONOGESTREL-ETHINYL ESTRADIOL 0.12-0.015 MG/24HR VA RING: VAGINAL_RING | VAGINAL | 3 refills | Status: DC

## 2019-01-23 NOTE — Progress Notes (Signed)
Linda Lewis Aug 26, 1970 [id number]    History:    Presents for annual exam.  Monthly 3-day cycle on NuvaRing without complaint.  Does have occasional hot flushes week off NuvaRing.  Normal Pap and mammogram history.  Reports weight is up 6 pounds unsure why weight gain, reassured normal BMI.  Past medical history, past surgical history, family history and social history were all reviewed and documented in the EPIC chart.  Owns a [employer].  Daughter well.  Mother hypertension.    ROS:  A ROS was performed and pertinent positives and negatives are included.  Exam:  Vitals:   01/23/19 1133  BP: 122/78  Weight: 121 lb (54.9 kg)  Height: 5\' 4"  (1.626 m)   Body mass index is 20.77 kg/m.   General appearance:  Normal Thyroid:  Symmetrical, normal in size, without palpable masses or nodularity. Respiratory  Auscultation:  Clear without wheezing or rhonchi Cardiovascular  Auscultation:  Regular rate, without rubs, murmurs or gallops  Edema/varicosities:  Not grossly evident Abdominal  Soft,nontender, without masses, guarding or rebound.  Liver/spleen:  No organomegaly noted  Hernia:  None appreciated  Skin  Inspection:  Grossly normal   Breasts: Examined lying and sitting.     Right: Without masses, retractions, discharge or axillary adenopathy.     Left: Without masses, retractions, discharge or axillary adenopathy. Gentitourinary   Inguinal/mons:  Normal without inguinal adenopathy  External genitalia:  Normal  BUS/Urethra/Skene's glands:  Normal  Vagina:  Normal  Cervix:  Normal  Uterus:   normal in size, shape and contour.  Midline and mobile  Adnexa/parametria:     Rt: Without masses or tenderness.   Lt: Without masses or tenderness.  Anus and perineum: Normal  Digital rectal exam: Normal sphincter tone without palpated masses or tenderness  Assessment/Plan:  48 y.o. MWF G2, P1 for annual exam with no complaints.  Monthly cycle on NuvaRing  Plan:  NuvaRing prescription, proper use, slight risk for blood clots and strokes reviewed.  Encouraged to leave NuvaRing out for only 3 days, reviewed most likely will decrease hot flushes menstrual week.  SBEs, continue annual screening mammogram, calcium rich foods, vitamin D 1000 daily encouraged.  CBC, CMP, TSH, lipid panel excellent 2018, not fasting will check next year.  Pap normal with negative HR HPV 2018, new screening guidelines reviewed.    [place], 12:56 PM 01/23/2019

## 2019-01-23 NOTE — Patient Instructions (Signed)
Health Maintenance, Female Adopting a healthy lifestyle and getting preventive care are important in promoting health and wellness. Ask your health care provider about:  The right schedule for you to have regular tests and exams.  Things you can do on your own to prevent diseases and keep yourself healthy. What should I know about diet, weight, and exercise? Eat a healthy diet   Eat a diet that includes plenty of vegetables, fruits, low-fat dairy products, and lean protein.  Do not eat a lot of foods that are high in solid fats, added sugars, or sodium. Maintain a healthy weight Body mass index (BMI) is used to identify weight problems. It estimates body fat based on height and weight. Your health care provider can help determine your BMI and help you achieve or maintain a healthy weight. Get regular exercise Get regular exercise. This is one of the most important things you can do for your health. Most adults should:  Exercise for at least 150 minutes each week. The exercise should increase your heart rate and make you sweat (moderate-intensity exercise).  Do strengthening exercises at least twice a week. This is in addition to the moderate-intensity exercise.  Spend less time sitting. Even light physical activity can be beneficial. Watch cholesterol and blood lipids Have your blood tested for lipids and cholesterol at 49 years of age, then have this test every 5 years. Have your cholesterol levels checked more often if:  Your lipid or cholesterol levels are high.  You are older than 49 years of age.  You are at high risk for heart disease. What should I know about cancer screening? Depending on your health history and family history, you may need to have cancer screening at various ages. This may include screening for:  Breast cancer.  Cervical cancer.  Colorectal cancer.  Skin cancer.  Lung cancer. What should I know about heart disease, diabetes, and high blood  pressure? Blood pressure and heart disease  High blood pressure causes heart disease and increases the risk of stroke. This is more likely to develop in people who have high blood pressure readings, are of African descent, or are overweight.  Have your blood pressure checked: ? Every 3-5 years if you are 18-39 years of age. ? Every year if you are 40 years old or older. Diabetes Have regular diabetes screenings. This checks your fasting blood sugar level. Have the screening done:  Once every three years after age 40 if you are at a normal weight and have a low risk for diabetes.  More often and at a younger age if you are overweight or have a high risk for diabetes. What should I know about preventing infection? Hepatitis B If you have a higher risk for hepatitis B, you should be screened for this virus. Talk with your health care provider to find out if you are at risk for hepatitis B infection. Hepatitis C Testing is recommended for:  Everyone born from 1945 through 1965.  Anyone with known risk factors for hepatitis C. Sexually transmitted infections (STIs)  Get screened for STIs, including gonorrhea and chlamydia, if: ? You are sexually active and are younger than 49 years of age. ? You are older than 49 years of age and your health care provider tells you that you are at risk for this type of infection. ? Your sexual activity has changed since you were last screened, and you are at increased risk for chlamydia or gonorrhea. Ask your health care provider if   you are at risk.  Ask your health care provider about whether you are at high risk for HIV. Your health care provider may recommend a prescription medicine to help prevent HIV infection. If you choose to take medicine to prevent HIV, you should first get tested for HIV. You should then be tested every 3 months for as long as you are taking the medicine. Pregnancy  If you are about to stop having your period (premenopausal) and  you may become pregnant, seek counseling before you get pregnant.  Take 400 to 800 micrograms (mcg) of folic acid every day if you become pregnant.  Ask for birth control (contraception) if you want to prevent pregnancy. Osteoporosis and menopause Osteoporosis is a disease in which the bones lose minerals and strength with aging. This can result in bone fractures. If you are 65 years old or older, or if you are at risk for osteoporosis and fractures, ask your health care provider if you should:  Be screened for bone loss.  Take a calcium or vitamin D supplement to lower your risk of fractures.  Be given hormone replacement therapy (HRT) to treat symptoms of menopause. Follow these instructions at home: Lifestyle  Do not use any products that contain nicotine or tobacco, such as cigarettes, e-cigarettes, and chewing tobacco. If you need help quitting, ask your health care provider.  Do not use street drugs.  Do not share needles.  Ask your health care provider for help if you need support or information about quitting drugs. Alcohol use  Do not drink alcohol if: ? Your health care provider tells you not to drink. ? You are pregnant, may be pregnant, or are planning to become pregnant.  If you drink alcohol: ? Limit how much you use to 0-1 drink a day. ? Limit intake if you are breastfeeding.  Be aware of how much alcohol is in your drink. In the [place] one drink equals one 12 oz bottle of beer (355 mL), one 5 oz glass of wine (148 mL), or one 1 oz glass of hard liquor (44 mL). General instructions  Schedule regular health, dental, and eye exams.  Stay current with your vaccines.  Tell your health care provider if: ? You often feel depressed. ? You have ever been abused or do not feel safe at home. Summary  Adopting a healthy lifestyle and getting preventive care are important in promoting health and wellness.  Follow your health care provider's instructions about healthy  diet, exercising, and getting tested or screened for diseases.  Follow your health care provider's instructions on monitoring your cholesterol and blood pressure. This information is not intended to replace advice given to you by your health care provider. Make sure you discuss any questions you have with your health care provider. Document Released: 01/12/2011 Document Revised: 06/22/2018 Document Reviewed: 06/22/2018 Elsevier Patient Education  2020 Elsevier Inc.  

## 2019-01-24 LAB — URINALYSIS, COMPLETE W/RFL CULTURE
Bacteria, UA: NONE SEEN /HPF
Bilirubin Urine: NEGATIVE
Glucose, UA: NEGATIVE
Hgb urine dipstick: NEGATIVE
Hyaline Cast: NONE SEEN /LPF
Ketones, ur: NEGATIVE
Leukocyte Esterase: NEGATIVE
Nitrites, Initial: NEGATIVE
Protein, ur: NEGATIVE
RBC / HPF: NONE SEEN /HPF (ref 0–2)
Specific Gravity, Urine: 1.033 (ref 1.001–1.03)
WBC, UA: NONE SEEN /HPF (ref 0–5)
pH: 5.5 (ref 5.0–8.0)

## 2019-01-24 LAB — CBC WITH DIFFERENTIAL/PLATELET
Absolute Monocytes: 332 cells/uL (ref 200–950)
Basophils Absolute: 62 cells/uL (ref 0–200)
Basophils Relative: 1.5 %
Eosinophils Absolute: 70 cells/uL (ref 15–500)
Eosinophils Relative: 1.7 %
HCT: 38.5 % (ref 35.0–45.0)
Hemoglobin: 12.9 g/dL (ref 11.7–15.5)
Lymphs Abs: 1275 cells/uL (ref 850–3900)
MCH: 32.3 pg (ref 27.0–33.0)
MCHC: 33.5 g/dL (ref 32.0–36.0)
MCV: 96.5 fL (ref 80.0–100.0)
MPV: 11.4 fL (ref 7.5–12.5)
Monocytes Relative: 8.1 %
Neutro Abs: 2362 cells/uL (ref 1500–7800)
Neutrophils Relative %: 57.6 %
Platelets: 236 10*3/uL (ref 140–400)
RBC: 3.99 10*6/uL (ref 3.80–5.10)
RDW: 11.8 % (ref 11.0–15.0)
Total Lymphocyte: 31.1 %
WBC: 4.1 10*3/uL (ref 3.8–10.8)

## 2019-01-24 LAB — NO CULTURE INDICATED

## 2019-01-24 LAB — COMPREHENSIVE METABOLIC PANEL
AG Ratio: 1.7 (calc) (ref 1.0–2.5)
ALT: 16 U/L (ref 6–29)
AST: 14 U/L (ref 10–35)
Albumin: 4 g/dL (ref 3.6–5.1)
Alkaline phosphatase (APISO): 73 U/L (ref 31–125)
BUN: 13 mg/dL (ref 7–25)
CO2: 25 mmol/L (ref 20–32)
Calcium: 9.3 mg/dL (ref 8.6–10.2)
Chloride: 105 mmol/L (ref 98–110)
Creat: 0.71 mg/dL (ref 0.50–1.10)
Globulin: 2.4 g/dL (calc) (ref 1.9–3.7)
Glucose, Bld: 109 mg/dL — ABNORMAL HIGH (ref 65–99)
Potassium: 4.3 mmol/L (ref 3.5–5.3)
Sodium: 137 mmol/L (ref 135–146)
Total Bilirubin: 0.4 mg/dL (ref 0.2–1.2)
Total Protein: 6.4 g/dL (ref 6.1–8.1)

## 2019-01-24 LAB — TSH: TSH: 0.46 mIU/L

## 2019-01-30 DIAGNOSIS — M9903 Segmental and somatic dysfunction of lumbar region: Secondary | ICD-10-CM | POA: Diagnosis not present

## 2019-01-30 DIAGNOSIS — M531 Cervicobrachial syndrome: Secondary | ICD-10-CM | POA: Diagnosis not present

## 2019-01-30 DIAGNOSIS — M5416 Radiculopathy, lumbar region: Secondary | ICD-10-CM | POA: Diagnosis not present

## 2019-01-30 DIAGNOSIS — M9901 Segmental and somatic dysfunction of cervical region: Secondary | ICD-10-CM | POA: Diagnosis not present

## 2019-02-02 DIAGNOSIS — H608X2 Other otitis externa, left ear: Secondary | ICD-10-CM | POA: Diagnosis not present

## 2019-02-27 DIAGNOSIS — M9901 Segmental and somatic dysfunction of cervical region: Secondary | ICD-10-CM | POA: Diagnosis not present

## 2019-02-27 DIAGNOSIS — M9903 Segmental and somatic dysfunction of lumbar region: Secondary | ICD-10-CM | POA: Diagnosis not present

## 2019-02-27 DIAGNOSIS — M531 Cervicobrachial syndrome: Secondary | ICD-10-CM | POA: Diagnosis not present

## 2019-02-27 DIAGNOSIS — M5416 Radiculopathy, lumbar region: Secondary | ICD-10-CM | POA: Diagnosis not present

## 2019-04-24 DIAGNOSIS — M9901 Segmental and somatic dysfunction of cervical region: Secondary | ICD-10-CM | POA: Diagnosis not present

## 2019-04-24 DIAGNOSIS — M9903 Segmental and somatic dysfunction of lumbar region: Secondary | ICD-10-CM | POA: Diagnosis not present

## 2019-04-24 DIAGNOSIS — M5416 Radiculopathy, lumbar region: Secondary | ICD-10-CM | POA: Diagnosis not present

## 2019-04-24 DIAGNOSIS — M531 Cervicobrachial syndrome: Secondary | ICD-10-CM | POA: Diagnosis not present

## 2019-05-22 DIAGNOSIS — M9903 Segmental and somatic dysfunction of lumbar region: Secondary | ICD-10-CM | POA: Diagnosis not present

## 2019-05-22 DIAGNOSIS — M9901 Segmental and somatic dysfunction of cervical region: Secondary | ICD-10-CM | POA: Diagnosis not present

## 2019-05-22 DIAGNOSIS — M5416 Radiculopathy, lumbar region: Secondary | ICD-10-CM | POA: Diagnosis not present

## 2019-05-22 DIAGNOSIS — M531 Cervicobrachial syndrome: Secondary | ICD-10-CM | POA: Diagnosis not present

## 2019-06-12 DIAGNOSIS — M9901 Segmental and somatic dysfunction of cervical region: Secondary | ICD-10-CM | POA: Diagnosis not present

## 2019-06-12 DIAGNOSIS — M9903 Segmental and somatic dysfunction of lumbar region: Secondary | ICD-10-CM | POA: Diagnosis not present

## 2019-06-12 DIAGNOSIS — M5416 Radiculopathy, lumbar region: Secondary | ICD-10-CM | POA: Diagnosis not present

## 2019-06-12 DIAGNOSIS — M531 Cervicobrachial syndrome: Secondary | ICD-10-CM | POA: Diagnosis not present

## 2019-07-09 DIAGNOSIS — Z20828 Contact with and (suspected) exposure to other viral communicable diseases: Secondary | ICD-10-CM | POA: Diagnosis not present

## 2019-07-10 ENCOUNTER — Other Ambulatory Visit: Payer: Self-pay | Admitting: Women's Health

## 2019-07-10 DIAGNOSIS — Z1231 Encounter for screening mammogram for malignant neoplasm of breast: Secondary | ICD-10-CM

## 2019-07-31 DIAGNOSIS — Z20828 Contact with and (suspected) exposure to other viral communicable diseases: Secondary | ICD-10-CM | POA: Diagnosis not present

## 2019-08-02 DIAGNOSIS — Z20828 Contact with and (suspected) exposure to other viral communicable diseases: Secondary | ICD-10-CM | POA: Diagnosis not present

## 2019-08-04 DIAGNOSIS — Z20828 Contact with and (suspected) exposure to other viral communicable diseases: Secondary | ICD-10-CM | POA: Diagnosis not present

## 2019-08-08 DIAGNOSIS — Z20828 Contact with and (suspected) exposure to other viral communicable diseases: Secondary | ICD-10-CM | POA: Diagnosis not present

## 2019-08-14 DIAGNOSIS — Z20828 Contact with and (suspected) exposure to other viral communicable diseases: Secondary | ICD-10-CM | POA: Diagnosis not present

## 2019-08-15 DIAGNOSIS — Z20828 Contact with and (suspected) exposure to other viral communicable diseases: Secondary | ICD-10-CM | POA: Diagnosis not present

## 2019-08-22 ENCOUNTER — Ambulatory Visit: Payer: BLUE CROSS/BLUE SHIELD

## 2019-09-07 DIAGNOSIS — M9903 Segmental and somatic dysfunction of lumbar region: Secondary | ICD-10-CM | POA: Diagnosis not present

## 2019-09-07 DIAGNOSIS — M531 Cervicobrachial syndrome: Secondary | ICD-10-CM | POA: Diagnosis not present

## 2019-09-07 DIAGNOSIS — M9901 Segmental and somatic dysfunction of cervical region: Secondary | ICD-10-CM | POA: Diagnosis not present

## 2019-09-07 DIAGNOSIS — M5416 Radiculopathy, lumbar region: Secondary | ICD-10-CM | POA: Diagnosis not present

## 2019-09-18 ENCOUNTER — Other Ambulatory Visit: Payer: Self-pay

## 2019-09-18 ENCOUNTER — Ambulatory Visit
Admission: RE | Admit: 2019-09-18 | Discharge: 2019-09-18 | Disposition: A | Discharge status: Home / Self Care | Payer: BC Managed Care – PPO | Source: Ambulatory Visit | Attending: Women's Health | Admitting: Women's Health

## 2019-09-18 DIAGNOSIS — Z1231 Encounter for screening mammogram for malignant neoplasm of breast: Secondary | ICD-10-CM | POA: Diagnosis not present

## 2019-10-04 DIAGNOSIS — M9903 Segmental and somatic dysfunction of lumbar region: Secondary | ICD-10-CM | POA: Diagnosis not present

## 2019-10-04 DIAGNOSIS — M9901 Segmental and somatic dysfunction of cervical region: Secondary | ICD-10-CM | POA: Diagnosis not present

## 2019-10-04 DIAGNOSIS — M531 Cervicobrachial syndrome: Secondary | ICD-10-CM | POA: Diagnosis not present

## 2019-10-04 DIAGNOSIS — M5416 Radiculopathy, lumbar region: Secondary | ICD-10-CM | POA: Diagnosis not present

## 2019-11-27 DIAGNOSIS — M9905 Segmental and somatic dysfunction of pelvic region: Secondary | ICD-10-CM | POA: Diagnosis not present

## 2019-11-27 DIAGNOSIS — M9903 Segmental and somatic dysfunction of lumbar region: Secondary | ICD-10-CM | POA: Diagnosis not present

## 2019-11-27 DIAGNOSIS — M9901 Segmental and somatic dysfunction of cervical region: Secondary | ICD-10-CM | POA: Diagnosis not present

## 2019-11-27 DIAGNOSIS — M9902 Segmental and somatic dysfunction of thoracic region: Secondary | ICD-10-CM | POA: Diagnosis not present

## 2019-12-14 DIAGNOSIS — M9905 Segmental and somatic dysfunction of pelvic region: Secondary | ICD-10-CM | POA: Diagnosis not present

## 2019-12-14 DIAGNOSIS — M9902 Segmental and somatic dysfunction of thoracic region: Secondary | ICD-10-CM | POA: Diagnosis not present

## 2019-12-14 DIAGNOSIS — M9901 Segmental and somatic dysfunction of cervical region: Secondary | ICD-10-CM | POA: Diagnosis not present

## 2019-12-14 DIAGNOSIS — M9903 Segmental and somatic dysfunction of lumbar region: Secondary | ICD-10-CM | POA: Diagnosis not present

## 2020-01-09 DIAGNOSIS — M9903 Segmental and somatic dysfunction of lumbar region: Secondary | ICD-10-CM | POA: Diagnosis not present

## 2020-01-09 DIAGNOSIS — M9905 Segmental and somatic dysfunction of pelvic region: Secondary | ICD-10-CM | POA: Diagnosis not present

## 2020-01-09 DIAGNOSIS — M9901 Segmental and somatic dysfunction of cervical region: Secondary | ICD-10-CM | POA: Diagnosis not present

## 2020-01-09 DIAGNOSIS — M9902 Segmental and somatic dysfunction of thoracic region: Secondary | ICD-10-CM | POA: Diagnosis not present

## 2020-01-16 ENCOUNTER — Ambulatory Visit (INDEPENDENT_AMBULATORY_CARE_PROVIDER_SITE_OTHER): Payer: BC Managed Care – PPO | Admitting: Nurse Practitioner

## 2020-01-16 ENCOUNTER — Other Ambulatory Visit: Payer: Self-pay

## 2020-01-16 ENCOUNTER — Encounter: Payer: Self-pay | Admitting: Nurse Practitioner

## 2020-01-16 VITALS — BP 126/80

## 2020-01-16 DIAGNOSIS — R1031 Right lower quadrant pain: Secondary | ICD-10-CM

## 2020-01-16 DIAGNOSIS — N83201 Unspecified ovarian cyst, right side: Secondary | ICD-10-CM | POA: Diagnosis not present

## 2020-01-16 NOTE — Progress Notes (Signed)
   Acute Office Visit  Subjective:    Patient ID: Linda Lewis, female    DOB: 1970-11-11, 49 y.o.   MRN: [id number]    HPI 49 year old G2P1011 presents for right lower quadrant abdominal pain. This is not new for her but it has become more frequent and more painful. Pain is described as sharp, intermittent, occurring multiple times per month. LMP 01/03/2020, more painful than normal, on Nuvaring. Ultrasound 02/2012 showed right ovary thick-walled cystic mass measuring 23x23x70mm, otherwise unremarkable. Taking Ibuprofen as needed with some relief. Urinary frequency but this is not new for her.    Review of Systems  Constitutional: Negative.   Gastrointestinal: Positive for abdominal pain (RLQ). Negative for abdominal distention, constipation, diarrhea, nausea and vomiting.  Genitourinary: Positive for frequency and pelvic pain (right). Negative for dysuria, vaginal bleeding, vaginal discharge and vaginal pain.       Objective:    Physical Exam Constitutional:      Appearance: Normal appearance.  Abdominal:     General: Abdomen is flat. There is no distension.     Palpations: Abdomen is soft. There is no mass.     Tenderness: There is no abdominal tenderness. There is no right CVA tenderness, left CVA tenderness, guarding or rebound.     Hernia: No hernia is present.  Genitourinary:    General: Normal vulva.     Urethra: Prolapse present.     Vagina: Normal.     Cervix: Normal.     Uterus: Normal.      BP 126/80 (BP Location: Right Arm, Patient Position: Sitting, Cuff Size: Normal)   LMP 01/03/2020  Wt Readings from Last 3 Encounters:  01/23/19 121 lb (54.9 kg)  01/17/18 115 lb (52.2 kg)  01/04/17 128 lb 9.6 oz (58.3 kg)        Assessment & Plan:   Problem List Items Addressed This Visit    None    Visit Diagnoses    Right lower quadrant abdominal pain    -  Primary   Right ovarian cyst       Relevant Orders   US PELVIS TRANSVAGINAL NON-OB (TV ONLY)       Plan: We will schedule an ultrasound due to history of right ovarian cyst and worsening pain. Also discussed urethral prolapse most likely the cause for urinary frequency and recommend Kegel exercises and pelvic floor strengthening. Take Ibuprofen as needed for pain.     Tamela Gammon [facility], 4:47 PM 01/16/2020

## 2020-01-25 ENCOUNTER — Telehealth: Payer: Self-pay | Admitting: *Deleted

## 2020-01-25 ENCOUNTER — Ambulatory Visit (INDEPENDENT_AMBULATORY_CARE_PROVIDER_SITE_OTHER): Payer: BC Managed Care – PPO | Admitting: Nurse Practitioner

## 2020-01-25 ENCOUNTER — Other Ambulatory Visit: Payer: Self-pay

## 2020-01-25 ENCOUNTER — Ambulatory Visit (INDEPENDENT_AMBULATORY_CARE_PROVIDER_SITE_OTHER): Payer: BC Managed Care – PPO

## 2020-01-25 ENCOUNTER — Encounter: Payer: Self-pay | Admitting: Nurse Practitioner

## 2020-01-25 VITALS — BP 122/80

## 2020-01-25 DIAGNOSIS — N83201 Unspecified ovarian cyst, right side: Secondary | ICD-10-CM

## 2020-01-25 DIAGNOSIS — R1031 Right lower quadrant pain: Secondary | ICD-10-CM | POA: Diagnosis not present

## 2020-01-25 NOTE — Telephone Encounter (Signed)
-----   Message from Tamela Gammon, NP sent at 01/25/2020  9:21 AM EDT ----- Please send referral to GI for RLQ abdominal pain.

## 2020-01-25 NOTE — Telephone Encounter (Signed)
Referral placed at [facility] they will call to schedule.  

## 2020-01-25 NOTE — Progress Notes (Signed)
History: 49 year old G2P1011 presents to discuss ultrasound.  Was seen by me 01/16/2020 for right lower quadrant abdominal pain that is not new for her but has become more frequent and more painful.  The pain is described as sharp, intermittent, occurring multiple times per month.  The pain is random and not related to her cycles.  LMP 01/03/2020, NuvaRing.  Ultrasound 02/2012 showed right ovarian thick-walled cystic mass measuring 23 x 23 x 19 mm, otherwise unremarkable.  Denies nausea, vomiting, diarrhea, and constipation.  Exam: Appears well Ultrasound: Anteverted uterus, normal size and shape, no myometrial masses.  Thin symmetrical endometrium 2.07 mm, no masses or thickening seen.  Both ovaries normal size.  No adnexal masses, no free fluid.  Assessment/Plan:  Right lower quadrant abdominal pain Normal ultrasound   Discussed ultrasound and provided reassurance on normal findings.  Recommend GI evaluation.  She is agreeable to plan.  We will send referral.

## 2020-01-26 ENCOUNTER — Encounter: Payer: Self-pay | Admitting: Internal Medicine

## 2020-01-29 ENCOUNTER — Ambulatory Visit (INDEPENDENT_AMBULATORY_CARE_PROVIDER_SITE_OTHER): Payer: BC Managed Care – PPO | Admitting: Nurse Practitioner

## 2020-01-29 ENCOUNTER — Encounter: Payer: Self-pay | Admitting: Nurse Practitioner

## 2020-01-29 ENCOUNTER — Other Ambulatory Visit: Payer: Self-pay

## 2020-01-29 VITALS — BP 122/80 | Ht 64.0 in | Wt 125.0 lb

## 2020-01-29 DIAGNOSIS — Z3044 Encounter for surveillance of vaginal ring hormonal contraceptive device: Secondary | ICD-10-CM | POA: Diagnosis not present

## 2020-01-29 DIAGNOSIS — Z01419 Encounter for gynecological examination (general) (routine) without abnormal findings: Secondary | ICD-10-CM

## 2020-01-29 MED ORDER — ETONOGESTREL-ETHINYL ESTRADIOL 0.12-0.015 MG/24HR VA RING: VAGINAL_RING | VAGINAL | 3 refills | Status: DC

## 2020-01-29 NOTE — Patient Instructions (Signed)
Health Maintenance, Female Adopting a healthy lifestyle and getting preventive care are important in promoting health and wellness. Ask your health care provider about:  The right schedule for you to have regular tests and exams.  Things you can do on your own to prevent diseases and keep yourself healthy. What should I know about diet, weight, and exercise? Eat a healthy diet   Eat a diet that includes plenty of vegetables, fruits, low-fat dairy products, and lean protein.  Do not eat a lot of foods that are high in solid fats, added sugars, or sodium. Maintain a healthy weight Body mass index (BMI) is used to identify weight problems. It estimates body fat based on height and weight. Your health care provider can help determine your BMI and help you achieve or maintain a healthy weight. Get regular exercise Get regular exercise. This is one of the most important things you can do for your health. Most adults should:  Exercise for at least 150 minutes each week. The exercise should increase your heart rate and make you sweat (moderate-intensity exercise).  Do strengthening exercises at least twice a week. This is in addition to the moderate-intensity exercise.  Spend less time sitting. Even light physical activity can be beneficial. Watch cholesterol and blood lipids Have your blood tested for lipids and cholesterol at 50 years of age, then have this test every 5 years. Have your cholesterol levels checked more often if:  Your lipid or cholesterol levels are high.  You are older than 50 years of age.  You are at high risk for heart disease. What should I know about cancer screening? Depending on your health history and family history, you may need to have cancer screening at various ages. This may include screening for:  Breast cancer.  Cervical cancer.  Colorectal cancer.  Skin cancer.  Lung cancer. What should I know about heart disease, diabetes, and high blood  pressure? Blood pressure and heart disease  High blood pressure causes heart disease and increases the risk of stroke. This is more likely to develop in people who have high blood pressure readings, are of African descent, or are overweight.  Have your blood pressure checked: ? Every 3-5 years if you are 18-39 years of age. ? Every year if you are 40 years old or older. Diabetes Have regular diabetes screenings. This checks your fasting blood sugar level. Have the screening done:  Once every three years after age 40 if you are at a normal weight and have a low risk for diabetes.  More often and at a younger age if you are overweight or have a high risk for diabetes. What should I know about preventing infection? Hepatitis B If you have a higher risk for hepatitis B, you should be screened for this virus. Talk with your health care provider to find out if you are at risk for hepatitis B infection. Hepatitis C Testing is recommended for:  Everyone born from 1945 through 1965.  Anyone with known risk factors for hepatitis C. Sexually transmitted infections (STIs)  Get screened for STIs, including gonorrhea and chlamydia, if: ? You are sexually active and are younger than 50 years of age. ? You are older than 50 years of age and your health care provider tells you that you are at risk for this type of infection. ? Your sexual activity has changed since you were last screened, and you are at increased risk for chlamydia or gonorrhea. Ask your health care provider if   you are at risk.  Ask your health care provider about whether you are at high risk for HIV. Your health care provider may recommend a prescription medicine to help prevent HIV infection. If you choose to take medicine to prevent HIV, you should first get tested for HIV. You should then be tested every 3 months for as long as you are taking the medicine. Pregnancy  If you are about to stop having your period (premenopausal) and  you may become pregnant, seek counseling before you get pregnant.  Take 400 to 800 micrograms (mcg) of folic acid every day if you become pregnant.  Ask for birth control (contraception) if you want to prevent pregnancy. Osteoporosis and menopause Osteoporosis is a disease in which the bones lose minerals and strength with aging. This can result in bone fractures. If you are 65 years old or older, or if you are at risk for osteoporosis and fractures, ask your health care provider if you should:  Be screened for bone loss.  Take a calcium or vitamin D supplement to lower your risk of fractures.  Be given hormone replacement therapy (HRT) to treat symptoms of menopause. Follow these instructions at home: Lifestyle  Do not use any products that contain nicotine or tobacco, such as cigarettes, e-cigarettes, and chewing tobacco. If you need help quitting, ask your health care provider.  Do not use street drugs.  Do not share needles.  Ask your health care provider for help if you need support or information about quitting drugs. Alcohol use  Do not drink alcohol if: ? Your health care provider tells you not to drink. ? You are pregnant, may be pregnant, or are planning to become pregnant.  If you drink alcohol: ? Limit how much you use to 0-1 drink a day. ? Limit intake if you are breastfeeding.  Be aware of how much alcohol is in your drink. In the [place] one drink equals one 12 oz bottle of beer (355 mL), one 5 oz glass of wine (148 mL), or one 1 oz glass of hard liquor (44 mL). General instructions  Schedule regular health, dental, and eye exams.  Stay current with your vaccines.  Tell your health care provider if: ? You often feel depressed. ? You have ever been abused or do not feel safe at home. Summary  Adopting a healthy lifestyle and getting preventive care are important in promoting health and wellness.  Follow your health care provider's instructions about healthy  diet, exercising, and getting tested or screened for diseases.  Follow your health care provider's instructions on monitoring your cholesterol and blood pressure. This information is not intended to replace advice given to you by your health care provider. Make sure you discuss any questions you have with your health care provider. Document Revised: 06/22/2018 Document Reviewed: 06/22/2018 Elsevier Patient Education  2020 [employer]vier Inc.  

## 2020-01-29 NOTE — Telephone Encounter (Signed)
Appointment on 03/26/20 with Dr.Perry

## 2020-01-29 NOTE — Progress Notes (Signed)
   Linda Lewis [birth year] [id number]   History:  49 y.o. G2P1011 presents for annual exam without GYN complaints. Monthly cycle on Nuvaring, occasional hot flashes. Normal pap and mammogram history.  Was seen by me 01/16/2020 for right lower quadrant abdominal pain with negative GYN work-up.  Has an appointment with GI in September.  No pain today. Sees a holistic doctor who is managing hypothyroidism.   Gynecologic History Patient's last menstrual period was 01/03/2020.   Contraception: NuvaRing vaginal inserts Last Pap: 01/04/2017. Results were: unsatisfactory for evaluation Last mammogram: 09/29/2019. Results were: nomral   Past medical history, past surgical history, family history and social history were all reviewed and documented in the EPIC chart.  ROS:  A ROS was performed and pertinent positives and negatives are included.  Exam:  Vitals:   01/29/20 0940  BP: 122/80  Weight: 125 lb (56.7 kg)  Height: 5\' 4"  (1.626 m)   Body mass index is 21.46 kg/m.  General appearance:  Normal Thyroid:  Symmetrical, normal in size, without palpable masses or nodularity. Respiratory  Auscultation:  Clear without wheezing or rhonchi Cardiovascular  Auscultation:  Regular rate, without rubs, murmurs or gallops  Edema/varicosities:  Not grossly evident Abdominal  Soft,nontender, without masses, guarding or rebound.  Liver/spleen:  No organomegaly noted  Hernia:  None appreciated  Skin  Inspection:  Grossly normal   Breasts: Examined lying and sitting.   Right: Without masses, retractions, discharge or axillary adenopathy.   Left: Without masses, retractions, discharge or axillary adenopathy. Gentitourinary   Inguinal/mons:  Normal without inguinal adenopathy  External genitalia:  Normal  BUS/Urethra/Skene's glands:  Normal  Vagina:  Normal  Cervix:  Stenotic  Uterus:  Anteverted, normal in size, shape and contour.  Midline and mobile  Adnexa/parametria:     Rt: Without  masses or tenderness.   Lt: Without masses or tenderness.  Anus and perineum: Normal  Digital rectal exam: Normal sphincter tone without palpated masses or tenderness  Assessment/Plan:  49 y.o. [id number] for annual exam.   Well female exam with routine gynecological exam - Plan: PAP,TP IMGw/HPV RNA,rflx [id number],17/35. Education provided on SBEs, importance of preventative screenings, current guidelines, high calcium diet, regular exercise, and multivitamin daily. Labs done with Holistic provider.   Encounter for surveillance of vaginal ring hormonal contraceptive device - Plan: etonogestrel-ethinyl estradiol (NUVARING) 0.12-0.015 MG/24HR vaginal ring. Refill x 1 year provided.  Follow up in 1 year for annual        [place], 10:03 AM 01/29/2020

## 2020-01-30 LAB — PAP, TP IMAGING W/ HPV RNA, RFLX HPV TYPE 16,18/45: HPV DNA High Risk: NOT DETECTED

## 2020-02-05 DIAGNOSIS — M9903 Segmental and somatic dysfunction of lumbar region: Secondary | ICD-10-CM | POA: Diagnosis not present

## 2020-02-05 DIAGNOSIS — M9905 Segmental and somatic dysfunction of pelvic region: Secondary | ICD-10-CM | POA: Diagnosis not present

## 2020-02-05 DIAGNOSIS — M9902 Segmental and somatic dysfunction of thoracic region: Secondary | ICD-10-CM | POA: Diagnosis not present

## 2020-02-05 DIAGNOSIS — M9901 Segmental and somatic dysfunction of cervical region: Secondary | ICD-10-CM | POA: Diagnosis not present

## 2020-02-22 DIAGNOSIS — M9905 Segmental and somatic dysfunction of pelvic region: Secondary | ICD-10-CM | POA: Diagnosis not present

## 2020-02-22 DIAGNOSIS — M9902 Segmental and somatic dysfunction of thoracic region: Secondary | ICD-10-CM | POA: Diagnosis not present

## 2020-02-22 DIAGNOSIS — M9903 Segmental and somatic dysfunction of lumbar region: Secondary | ICD-10-CM | POA: Diagnosis not present

## 2020-02-22 DIAGNOSIS — M9901 Segmental and somatic dysfunction of cervical region: Secondary | ICD-10-CM | POA: Diagnosis not present

## 2020-03-26 ENCOUNTER — Other Ambulatory Visit (INDEPENDENT_AMBULATORY_CARE_PROVIDER_SITE_OTHER): Payer: BC Managed Care – PPO

## 2020-03-26 ENCOUNTER — Ambulatory Visit (INDEPENDENT_AMBULATORY_CARE_PROVIDER_SITE_OTHER): Payer: BC Managed Care – PPO | Admitting: Internal Medicine

## 2020-03-26 ENCOUNTER — Encounter: Payer: Self-pay | Admitting: Internal Medicine

## 2020-03-26 VITALS — BP 120/80 | HR 68 | Ht 64.5 in | Wt 127.0 lb

## 2020-03-26 DIAGNOSIS — R1031 Right lower quadrant pain: Secondary | ICD-10-CM

## 2020-03-26 DIAGNOSIS — R110 Nausea: Secondary | ICD-10-CM | POA: Diagnosis not present

## 2020-03-26 DIAGNOSIS — Z1211 Encounter for screening for malignant neoplasm of colon: Secondary | ICD-10-CM | POA: Diagnosis not present

## 2020-03-26 LAB — URINALYSIS, ROUTINE W REFLEX MICROSCOPIC
Bilirubin Urine: NEGATIVE
Hgb urine dipstick: NEGATIVE
Ketones, ur: NEGATIVE
Leukocytes,Ua: NEGATIVE
Nitrite: NEGATIVE
RBC / HPF: NONE SEEN (ref 0–?)
Specific Gravity, Urine: 1.025 (ref 1.000–1.030)
Total Protein, Urine: NEGATIVE
Urine Glucose: NEGATIVE
Urobilinogen, UA: 0.2 (ref 0.0–1.0)
WBC, UA: NONE SEEN (ref 0–?)
pH: 6.5 (ref 5.0–8.0)

## 2020-03-26 MED ORDER — SUTAB 1479-225-188 MG PO TABS: 1.0000 | ORAL_TABLET | Freq: Once | ORAL | 0 refills | Status: AC

## 2020-03-26 NOTE — Patient Instructions (Signed)
Your provider has requested that you go to the basement level for lab work before leaving today. Press "B" on the elevator. The lab is located at the first door on the left as you exit the elevator.  You have been scheduled for an abdominal ultrasound at [facility] (1st floor of hospital) on 9/21/2021at 10:30am. Please arrive 15 minutes prior to your appointment for registration. Make certain not to have anything to eat or drink after midnight prior to your appointment. Should you need to reschedule your appointment, please contact radiology at [phone]. This test typically takes about 30 minutes to perform.  You have been scheduled for an endoscopy. Please follow written instructions given to you at your visit today. If you use inhalers (even only as needed), please bring them with you on the day of your procedure.

## 2020-03-26 NOTE — Progress Notes (Signed)
HISTORY OF PRESENT ILLNESS:  Linda Lewis is a pleasant 49 y.o. 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  Patient has not had prior GI evaluation of this problem.  She reports to me at least a several year history of intermittent focal right lower quadrant pain.  It is described as sharp and may last up to 2 hours.  There are no obvious exacerbating factors such as meals, activity, bowel movements.  Problems with pain seem to be more common in the evenings.  However, this does not awaken her from sleep.  She does take ibuprofen for the discomfort, which helps.  She does have some nausea in the mornings approximately once per month.  No vomiting.  She drinks water, and this helps.  She has no past surgical history.  No history of gynecologic problems such as endometriosis or tubal pregnancy.  She describes her bowel habits as regular and soft and unchanged.  Review of outside blood work from 02/22/2020 reveals normal comprehensive metabolic panel including liver test.  Normal hemoglobin 12.9.  She did undergo pelvic ultrasound January 25, 2020.  This was unremarkable.  She denies a history of back or urologic problems.  No dysuria.  Her weight has been stable.  She has no primary care provider aside from her gynecology team.  She is seeing a holistic specialist for overall health and wellbeing.  No family history of gastrointestinal malignancy or IBD.  She does not smoke.  Enjoys 1 alcoholic beverage per day.  She has completed her Covid vaccination series via [employer].  REVIEW OF SYSTEMS:  All non-GI ROS negative unless otherwise stated in the HPI except for headaches, sinus and allergy  Past Medical History:  Diagnosis Date  . No pertinent past medical history     Past Surgical History:  Procedure Laterality Date  . NO PAST SURGERIES      Social History Marisella Puccio  reports that she has never smoked. She has never  used smokeless tobacco. She reports current alcohol use. She reports that she does not use drugs.  family history includes Alcoholism in her father; Hypertension in her mother.  No Known Allergies     PHYSICAL EXAMINATION: Vital signs: BP 120/80 (BP Location: Left Arm, Patient Position: Sitting, Cuff Size: Normal)   Pulse 68   Ht 5' 4.5" (1.638 m) Comment: height measured without shoes  Wt 127 lb (57.6 kg)   LMP 03/21/2020   BMI 21.46 kg/m   Constitutional: generally well-appearing, no acute distress Psychiatric: alert and oriented x3, cooperative Eyes: extraocular movements intact, anicteric, conjunctiva pink Mouth: oral pharynx moist, no lesions Neck: supple no lymphadenopathy Cardiovascular: heart regular rate and rhythm, no murmur Lungs: clear to auscultation bilaterally Abdomen: soft, nontender, nondistended, no obvious ascites, no peritoneal signs, normal bowel sounds, no organomegaly Rectal: Deferred until colonoscopy Extremities: no clubbing, cyanosis, or lower extremity edema bilaterally Skin: no lesions on visible extremities Neuro: No focal deficits.  Cranial nerves intact  ASSESSMENT:  1.  Chronic intermittent, severe at times, right lower quadrant pain as described.  Etiology unclear.  Negative recent GYN evaluation. 2.  Infrequent short-lived morning nausea without alarm features 3.  Colon cancer screening.  Baseline risk.  Appropriate candidate without contraindication   PLAN:  1.  Schedule colonoscopy to provide colon cancer screening and evaluate chronic right upper quadrant pain.The nature of the procedure, as well as the risks, benefits, and alternatives were carefully and thoroughly  reviewed with the patient. Ample time for discussion and questions allowed. The patient understood, was satisfied, and agreed to proceed. 2.  Schedule urinalysis screen for possible urologic process to explain intermittent lower quadrant pain.  We will contact the patient after  the results are available 3.  Abdominal ultrasound.  Rule out gallstones.  Cystic kidney and liver.  We will contact patient with results are available 4.  If the above unrevealing, consider advanced imaging via CT.

## 2020-04-02 ENCOUNTER — Ambulatory Visit (HOSPITAL_COMMUNITY): Admission: RE | Admit: 2020-04-02 | Payer: BC Managed Care – PPO | Source: Ambulatory Visit

## 2020-04-03 ENCOUNTER — Other Ambulatory Visit: Payer: Self-pay | Admitting: *Deleted

## 2020-04-03 ENCOUNTER — Telehealth: Payer: Self-pay | Admitting: Internal Medicine

## 2020-04-03 DIAGNOSIS — Z3044 Encounter for surveillance of vaginal ring hormonal contraceptive device: Secondary | ICD-10-CM

## 2020-04-03 MED ORDER — ETONOGESTREL-ETHINYL ESTRADIOL 0.12-0.015 MG/24HR VA RING: VAGINAL_RING | VAGINAL | 3 refills | Status: DC

## 2020-04-03 NOTE — Telephone Encounter (Signed)
Annual was on 01/29/20

## 2020-04-04 NOTE — Telephone Encounter (Signed)
Patient is calling to follow up on previous message states the S[employer] requires auth please advise or send alternative medication

## 2020-04-05 ENCOUNTER — Ambulatory Visit (HOSPITAL_COMMUNITY): Payer: BC Managed Care – PPO

## 2020-04-05 NOTE — Telephone Encounter (Signed)
Correct codes run for [employer] to make $40. Patient called and informed.

## 2020-04-08 ENCOUNTER — Other Ambulatory Visit: Payer: Self-pay

## 2020-04-08 ENCOUNTER — Ambulatory Visit (HOSPITAL_COMMUNITY)
Admission: RE | Admit: 2020-04-08 | Discharge: 2020-04-08 | Disposition: A | Discharge status: Home / Self Care | Payer: BC Managed Care – PPO | Source: Ambulatory Visit | Attending: Internal Medicine | Admitting: Internal Medicine

## 2020-04-08 DIAGNOSIS — R1031 Right lower quadrant pain: Secondary | ICD-10-CM | POA: Diagnosis not present

## 2020-04-08 DIAGNOSIS — R109 Unspecified abdominal pain: Secondary | ICD-10-CM | POA: Diagnosis not present

## 2020-04-08 DIAGNOSIS — Z1211 Encounter for screening for malignant neoplasm of colon: Secondary | ICD-10-CM | POA: Insufficient documentation

## 2020-04-09 ENCOUNTER — Ambulatory Visit (AMBULATORY_SURGERY_CENTER): Payer: BC Managed Care – PPO | Admitting: Internal Medicine

## 2020-04-09 ENCOUNTER — Other Ambulatory Visit: Payer: Self-pay

## 2020-04-09 ENCOUNTER — Encounter: Payer: Self-pay | Admitting: Internal Medicine

## 2020-04-09 VITALS — BP 119/80 | HR 61 | Temp 97.8°F | Resp 20 | Ht 64.0 in | Wt 127.0 lb

## 2020-04-09 DIAGNOSIS — Z1211 Encounter for screening for malignant neoplasm of colon: Secondary | ICD-10-CM

## 2020-04-09 DIAGNOSIS — R1031 Right lower quadrant pain: Secondary | ICD-10-CM

## 2020-04-09 DIAGNOSIS — K621 Rectal polyp: Secondary | ICD-10-CM

## 2020-04-09 DIAGNOSIS — D128 Benign neoplasm of rectum: Secondary | ICD-10-CM

## 2020-04-09 MED ORDER — SODIUM CHLORIDE 0.9 % IV SOLN: 500.0000 mL | Freq: Once | INTRAVENOUS | Status: DC

## 2020-04-09 NOTE — Progress Notes (Signed)
Pt's states no medical or surgical changes since previsit or office visit.  SF - vitals

## 2020-04-09 NOTE — Op Note (Signed)
[facility] Patient Name: Linda Lewis Procedure Date: 04/09/2020 9:12 AM MRN: [id number] Endoscopist: Docia Chuck. Henrene Pastor , MD Age: 49 Referring MD:  Date of Birth: 06/18/1971 Gender: Female Account #: [id number] Procedure:                Colonoscopy with cold snare polypectomy x 1 Indications:              Screening for colorectal malignant neoplasm.                            Incidental complaint of chronic intermittent right                            lower quadrant pain Medicines:                Monitored Anesthesia Care Procedure:                Pre-Anesthesia Assessment:                           - Prior to the procedure, a History and Physical                            was performed, and patient medications and                            allergies were reviewed. The patient's tolerance of                            previous anesthesia was also reviewed. The risks                            and benefits of the procedure and the sedation                            options and risks were discussed with the patient.                            All questions were answered, and informed consent                            was obtained. Prior Anticoagulants: The patient has                            taken no previous anticoagulant or antiplatelet                            agents. ASA Grade Assessment: I - A normal, healthy                            patient. After reviewing the risks and benefits,                            the patient was deemed in satisfactory condition to  undergo the procedure.                           After obtaining informed consent, the colonoscope                            was passed under direct vision. Throughout the                            procedure, the patient's blood pressure, pulse, and                            oxygen saturations were monitored continuously. The                            Colonoscope was  introduced through the anus and                            advanced to the the cecum, identified by                            appendiceal orifice and ileocecal valve. The                            terminal ileum, ileocecal valve, appendiceal                            orifice, and rectum were photographed. The quality                            of the bowel preparation was excellent. The                            colonoscopy was performed without difficulty. The                            patient tolerated the procedure well. The bowel                            preparation used was SUPREP via split dose                            instruction. Scope In: 9:31:16 AM Scope Out: 9:45:39 AM Scope Withdrawal Time: 0 hours 11 minutes 9 seconds  Total Procedure Duration: 0 hours 14 minutes 23 seconds  Findings:                 The terminal ileum appeared normal.                           A 2 mm polyp was found in the rectum. The polyp was                            removed with a cold snare. Resection and retrieval  were complete.                           The exam was otherwise without abnormality on                            direct and retroflexion views. Complications:            No immediate complications. Estimated blood loss:                            None. Estimated Blood Loss:     Estimated blood loss: none. Impression:               - The examined portion of the ileum was normal.                           - One 2 mm polyp in the rectum, removed with a cold                            snare. Resected and retrieved.                           - The examination was otherwise normal on direct                            and retroflexion views. Recommendation:           - Repeat colonoscopy in 10 years for surveillance.                           - Patient has a contact number available for                            emergencies. The signs and symptoms of  potential                            delayed complications were discussed with the                            patient. Return to normal activities tomorrow.                            Written discharge instructions were provided to the                            patient.                           - Resume previous diet.                           - Continue present medications.                           - Await pathology results. Docia Chuck. Henrene Pastor, MD 04/09/2020 9:50:30 AM This report has been signed electronically.

## 2020-04-09 NOTE — Patient Instructions (Signed)
YOU HAD AN ENDOSCOPIC PROCEDURE TODAY AT THE [facility]:   Refer to the procedure report that was given to you for any specific questions about what was found during the examination.  If the procedure report does not answer your questions, please call your gastroenterologist to clarify.  If you requested that your care partner not be given the details of your procedure findings, then the procedure report has been included in a sealed envelope for you to review at your convenience later.  YOU SHOULD EXPECT: Some feelings of bloating in the abdomen. Passage of more gas than usual.  Walking can help get rid of the air that was put into your GI tract during the procedure and reduce the bloating. If you had a lower endoscopy (such as a colonoscopy or flexible sigmoidoscopy) you may notice spotting of blood in your stool or on the toilet paper. If you underwent a bowel prep for your procedure, you may not have a normal bowel movement for a few days.  Please Note:  You might notice some irritation and congestion in your nose or some drainage.  This is from the oxygen used during your procedure.  There is no need for concern and it should clear up in a day or so.  SYMPTOMS TO REPORT IMMEDIATELY:   Following lower endoscopy (colonoscopy or flexible sigmoidoscopy):  Excessive amounts of blood in the stool  Significant tenderness or worsening of abdominal pains  Swelling of the abdomen that is new, acute  Fever of 100F or higher  For urgent or emergent issues, a gastroenterologist can be reached at any hour by calling [phone]. Do not use MyChart messaging for urgent concerns.    DIET:  We do recommend a small meal at first, but then you may proceed to your regular diet.  Drink plenty of fluids but you should avoid alcoholic beverages for 24 hours.  ACTIVITY:  You should plan to take it easy for the rest of today and you should NOT DRIVE or use heavy machinery until tomorrow (because  of the sedation medicines used during the test).    FOLLOW UP: Our staff will call the number listed on your records 48-72 hours following your procedure to check on you and address any questions or concerns that you may have regarding the information given to you following your procedure. If we do not reach you, we will leave a message.  We will attempt to reach you two times.  During this call, we will ask if you have developed any symptoms of COVID 19. If you develop any symptoms (ie: fever, flu-like symptoms, shortness of breath, cough etc.) before then, please call [phone].  If you test positive for Covid 19 in the 2 weeks post procedure, please call and report this information to us.    If any biopsies were taken you will be contacted by phone or by letter within the next 1-3 weeks.  Please call us at [phone] if you have not heard about the biopsies in 3 weeks.    SIGNATURES/CONFIDENTIALITY: You and/or your care partner have signed paperwork which will be entered into your electronic medical record.  These signatures attest to the fact that that the information above on your After Visit Summary has been reviewed and is understood.  Full responsibility of the confidentiality of this discharge information lies with you and/or your care-partner. 

## 2020-04-09 NOTE — Progress Notes (Signed)
Report to PACU, RN, vss, BBS= Clear.  

## 2020-04-09 NOTE — Progress Notes (Signed)
Called to room to assist during endoscopic procedure.  Patient ID and intended procedure confirmed with present staff. Received instructions for my participation in the procedure from the performing physician.  

## 2020-04-11 ENCOUNTER — Telehealth: Payer: Self-pay | Admitting: *Deleted

## 2020-04-11 NOTE — Telephone Encounter (Signed)
  Follow up Call-  Call back number 04/09/2020  Post procedure Call Back phone  # [phone]  Permission to leave phone message Yes  Some recent data might be hidden     Patient questions:  Do you have a fever, pain , or abdominal swelling? No. Pain Score  0 *  Have you tolerated food without any problems? Yes.    Have you been able to return to your normal activities? Yes.    Do you have any questions about your discharge instructions: Diet   No. Medications  No. Follow up visit  No.  Do you have questions or concerns about your Care? No.  Actions: * If pain score is 4 or above: No action needed, pain <4  1. Have you developed a fever since your procedure? NO  2.   Have you had an respiratory symptoms (SOB or cough) since your procedure? NO  3.   Have you tested positive for COVID 19 since your procedure NO  4.   Have you had any family members/close contacts diagnosed with the COVID 19 since your procedure?  NO   If yes to any of these questions please route to Joylene John, RN and Joella Prince, RN

## 2020-04-12 ENCOUNTER — Encounter: Payer: Self-pay | Admitting: Internal Medicine

## 2020-05-14 DIAGNOSIS — M9902 Segmental and somatic dysfunction of thoracic region: Secondary | ICD-10-CM | POA: Diagnosis not present

## 2020-05-14 DIAGNOSIS — M9901 Segmental and somatic dysfunction of cervical region: Secondary | ICD-10-CM | POA: Diagnosis not present

## 2020-05-14 DIAGNOSIS — M9905 Segmental and somatic dysfunction of pelvic region: Secondary | ICD-10-CM | POA: Diagnosis not present

## 2020-05-14 DIAGNOSIS — M9903 Segmental and somatic dysfunction of lumbar region: Secondary | ICD-10-CM | POA: Diagnosis not present

## 2020-06-17 DIAGNOSIS — M9905 Segmental and somatic dysfunction of pelvic region: Secondary | ICD-10-CM | POA: Diagnosis not present

## 2020-06-17 DIAGNOSIS — M9902 Segmental and somatic dysfunction of thoracic region: Secondary | ICD-10-CM | POA: Diagnosis not present

## 2020-06-17 DIAGNOSIS — M9903 Segmental and somatic dysfunction of lumbar region: Secondary | ICD-10-CM | POA: Diagnosis not present

## 2020-06-17 DIAGNOSIS — M9901 Segmental and somatic dysfunction of cervical region: Secondary | ICD-10-CM | POA: Diagnosis not present

## 2020-06-27 DIAGNOSIS — H903 Sensorineural hearing loss, bilateral: Secondary | ICD-10-CM | POA: Diagnosis not present

## 2020-06-27 DIAGNOSIS — H6063 Unspecified chronic otitis externa, bilateral: Secondary | ICD-10-CM | POA: Diagnosis not present

## 2020-07-22 DIAGNOSIS — M9903 Segmental and somatic dysfunction of lumbar region: Secondary | ICD-10-CM | POA: Diagnosis not present

## 2020-07-22 DIAGNOSIS — M9905 Segmental and somatic dysfunction of pelvic region: Secondary | ICD-10-CM | POA: Diagnosis not present

## 2020-07-22 DIAGNOSIS — M9901 Segmental and somatic dysfunction of cervical region: Secondary | ICD-10-CM | POA: Diagnosis not present

## 2020-07-22 DIAGNOSIS — M9902 Segmental and somatic dysfunction of thoracic region: Secondary | ICD-10-CM | POA: Diagnosis not present

## 2020-08-19 DIAGNOSIS — M9905 Segmental and somatic dysfunction of pelvic region: Secondary | ICD-10-CM | POA: Diagnosis not present

## 2020-08-19 DIAGNOSIS — M9902 Segmental and somatic dysfunction of thoracic region: Secondary | ICD-10-CM | POA: Diagnosis not present

## 2020-08-19 DIAGNOSIS — M9903 Segmental and somatic dysfunction of lumbar region: Secondary | ICD-10-CM | POA: Diagnosis not present

## 2020-08-19 DIAGNOSIS — M9901 Segmental and somatic dysfunction of cervical region: Secondary | ICD-10-CM | POA: Diagnosis not present

## 2020-09-02 DIAGNOSIS — M25512 Pain in left shoulder: Secondary | ICD-10-CM | POA: Diagnosis not present

## 2020-09-02 DIAGNOSIS — M25511 Pain in right shoulder: Secondary | ICD-10-CM | POA: Diagnosis not present

## 2020-09-02 DIAGNOSIS — M542 Cervicalgia: Secondary | ICD-10-CM | POA: Diagnosis not present

## 2020-09-02 DIAGNOSIS — M4723 Other spondylosis with radiculopathy, cervicothoracic region: Secondary | ICD-10-CM | POA: Diagnosis not present

## 2020-09-03 ENCOUNTER — Other Ambulatory Visit: Payer: Self-pay | Admitting: Nurse Practitioner

## 2020-09-03 ENCOUNTER — Other Ambulatory Visit: Payer: Self-pay | Admitting: Internal Medicine

## 2020-09-03 DIAGNOSIS — Z1231 Encounter for screening mammogram for malignant neoplasm of breast: Secondary | ICD-10-CM

## 2020-09-11 DIAGNOSIS — M25511 Pain in right shoulder: Secondary | ICD-10-CM | POA: Diagnosis not present

## 2020-09-11 DIAGNOSIS — M542 Cervicalgia: Secondary | ICD-10-CM | POA: Diagnosis not present

## 2020-09-11 DIAGNOSIS — M4723 Other spondylosis with radiculopathy, cervicothoracic region: Secondary | ICD-10-CM | POA: Diagnosis not present

## 2020-09-11 DIAGNOSIS — M25512 Pain in left shoulder: Secondary | ICD-10-CM | POA: Diagnosis not present

## 2020-09-13 DIAGNOSIS — M25511 Pain in right shoulder: Secondary | ICD-10-CM | POA: Diagnosis not present

## 2020-09-13 DIAGNOSIS — M4723 Other spondylosis with radiculopathy, cervicothoracic region: Secondary | ICD-10-CM | POA: Diagnosis not present

## 2020-09-13 DIAGNOSIS — M25512 Pain in left shoulder: Secondary | ICD-10-CM | POA: Diagnosis not present

## 2020-09-13 DIAGNOSIS — M542 Cervicalgia: Secondary | ICD-10-CM | POA: Diagnosis not present

## 2020-09-18 DIAGNOSIS — M25511 Pain in right shoulder: Secondary | ICD-10-CM | POA: Diagnosis not present

## 2020-09-18 DIAGNOSIS — M25512 Pain in left shoulder: Secondary | ICD-10-CM | POA: Diagnosis not present

## 2020-09-18 DIAGNOSIS — M542 Cervicalgia: Secondary | ICD-10-CM | POA: Diagnosis not present

## 2020-09-18 DIAGNOSIS — M4723 Other spondylosis with radiculopathy, cervicothoracic region: Secondary | ICD-10-CM | POA: Diagnosis not present

## 2020-09-20 DIAGNOSIS — M542 Cervicalgia: Secondary | ICD-10-CM | POA: Diagnosis not present

## 2020-09-20 DIAGNOSIS — M4723 Other spondylosis with radiculopathy, cervicothoracic region: Secondary | ICD-10-CM | POA: Diagnosis not present

## 2020-09-20 DIAGNOSIS — M25511 Pain in right shoulder: Secondary | ICD-10-CM | POA: Diagnosis not present

## 2020-09-20 DIAGNOSIS — M25512 Pain in left shoulder: Secondary | ICD-10-CM | POA: Diagnosis not present

## 2020-09-25 DIAGNOSIS — M25511 Pain in right shoulder: Secondary | ICD-10-CM | POA: Diagnosis not present

## 2020-09-25 DIAGNOSIS — M4723 Other spondylosis with radiculopathy, cervicothoracic region: Secondary | ICD-10-CM | POA: Diagnosis not present

## 2020-09-25 DIAGNOSIS — M542 Cervicalgia: Secondary | ICD-10-CM | POA: Diagnosis not present

## 2020-09-25 DIAGNOSIS — M25512 Pain in left shoulder: Secondary | ICD-10-CM | POA: Diagnosis not present

## 2020-10-02 DIAGNOSIS — M4723 Other spondylosis with radiculopathy, cervicothoracic region: Secondary | ICD-10-CM | POA: Diagnosis not present

## 2020-10-02 DIAGNOSIS — M25512 Pain in left shoulder: Secondary | ICD-10-CM | POA: Diagnosis not present

## 2020-10-02 DIAGNOSIS — M542 Cervicalgia: Secondary | ICD-10-CM | POA: Diagnosis not present

## 2020-10-02 DIAGNOSIS — M25511 Pain in right shoulder: Secondary | ICD-10-CM | POA: Diagnosis not present

## 2020-10-09 DIAGNOSIS — M25512 Pain in left shoulder: Secondary | ICD-10-CM | POA: Diagnosis not present

## 2020-10-09 DIAGNOSIS — M542 Cervicalgia: Secondary | ICD-10-CM | POA: Diagnosis not present

## 2020-10-09 DIAGNOSIS — M4723 Other spondylosis with radiculopathy, cervicothoracic region: Secondary | ICD-10-CM | POA: Diagnosis not present

## 2020-10-09 DIAGNOSIS — M25511 Pain in right shoulder: Secondary | ICD-10-CM | POA: Diagnosis not present

## 2020-10-16 DIAGNOSIS — M25512 Pain in left shoulder: Secondary | ICD-10-CM | POA: Diagnosis not present

## 2020-10-16 DIAGNOSIS — M4723 Other spondylosis with radiculopathy, cervicothoracic region: Secondary | ICD-10-CM | POA: Diagnosis not present

## 2020-10-16 DIAGNOSIS — M542 Cervicalgia: Secondary | ICD-10-CM | POA: Diagnosis not present

## 2020-10-16 DIAGNOSIS — M25511 Pain in right shoulder: Secondary | ICD-10-CM | POA: Diagnosis not present

## 2020-10-23 ENCOUNTER — Other Ambulatory Visit: Payer: Self-pay

## 2020-10-23 ENCOUNTER — Ambulatory Visit
Admission: RE | Admit: 2020-10-23 | Discharge: 2020-10-23 | Disposition: A | Discharge status: Home / Self Care | Payer: BC Managed Care – PPO | Source: Ambulatory Visit | Attending: Nurse Practitioner | Admitting: Nurse Practitioner

## 2020-10-23 DIAGNOSIS — Z1231 Encounter for screening mammogram for malignant neoplasm of breast: Secondary | ICD-10-CM | POA: Diagnosis not present

## 2020-10-24 DIAGNOSIS — R1031 Right lower quadrant pain: Secondary | ICD-10-CM | POA: Diagnosis not present

## 2020-10-24 DIAGNOSIS — R630 Anorexia: Secondary | ICD-10-CM | POA: Diagnosis not present

## 2020-10-24 DIAGNOSIS — R110 Nausea: Secondary | ICD-10-CM | POA: Diagnosis not present

## 2020-10-24 DIAGNOSIS — R1011 Right upper quadrant pain: Secondary | ICD-10-CM | POA: Diagnosis not present

## 2020-10-24 DIAGNOSIS — R109 Unspecified abdominal pain: Secondary | ICD-10-CM | POA: Diagnosis not present

## 2020-11-01 ENCOUNTER — Ambulatory Visit (INDEPENDENT_AMBULATORY_CARE_PROVIDER_SITE_OTHER): Payer: BC Managed Care – PPO | Admitting: Physician Assistant

## 2020-11-01 ENCOUNTER — Encounter: Payer: Self-pay | Admitting: Physician Assistant

## 2020-11-01 VITALS — BP 110/76 | HR 76 | Ht 64.5 in | Wt 127.5 lb

## 2020-11-01 DIAGNOSIS — R1013 Epigastric pain: Secondary | ICD-10-CM | POA: Diagnosis not present

## 2020-11-01 DIAGNOSIS — R1011 Right upper quadrant pain: Secondary | ICD-10-CM

## 2020-11-01 MED ORDER — PANTOPRAZOLE SODIUM 40 MG PO TBEC: 40.0000 mg | DELAYED_RELEASE_TABLET | Freq: Every day | ORAL | 3 refills | Status: DC

## 2020-11-01 NOTE — Progress Notes (Signed)
Subjective:    Patient ID: Linda Lewis, female    DOB: 1971-03-24, 50 y.o.   MRN: [id number]  HPI Linda Lewis is a pleasant 50 year old female, established with Linda Lewis.  She comes in today with complaints of right upper quadrant pain which she says has been present over the past 3 to 4 months and progressive.  She describes the pain as being sharp and present on a daily basis though intermittent throughout the day.  She feels that symptoms are exacerbated postprandially and that she has some sense of filling up quickly.  She has had some associated nausea but no vomiting.  No heartburn or indigestion. No changes in bowel habits. She had last been seen here in September 2021 at that time with complaints of right lower quadrant pain.  She underwent colonoscopy in September 2021 with finding of 1 to millimeter polyp which was removed and found to be hyperplastic, exam was otherwise negative. She had upper abdominal ultrasound September 2021 which was normal, no gallstones or gallbladder wall thickening. She had an ER visit through [facility] on 10/24/2020 with a right upper quadrant pain.  Labs have been reviewed and CBC and c-Met were unremarkable, beta hCG negative and UA negative. She had upper abdominal ultrasound done at that same time which was negative and CT of the abdomen and pelvis with IV contrast also read as negative. Patient says she has not had any weight loss.  No regular use of aspirin or NSAIDs, no regular EtOH.  Review of Systems Pertinent positive and negative review of systems were noted in the above HPI section.  All other review of systems was otherwise negative.  Outpatient Encounter Medications as of 11/01/2020  Medication Sig  . AMBULATORY NON FORMULARY MEDICATION ADAPTOCRINE K2 Take 1 tablet by mouth once daily  . B Complex-C (SUPER B COMPLEX PO) Take 1 tablet by mouth daily.  Marland Kitchen etonogestrel-ethinyl estradiol (NUVARING) 0.12-0.015 MG/24HR vaginal ring INSERT 1 RING  VAGINALLY AND LEAVE IN PLACE FOR 3 WEEKS AND THEN REMOVE FOR 1 WEEK  . Multiple Vitamins-Iron (MULTIVITAMIN/IRON PO) Take 1 tablet by mouth in the morning and at bedtime.  . Nutritional Supplements (DHEA PO) Take 1 tablet by mouth daily.  . Omega-3 Fatty Acids (FISH OIL PO) Take 1 tablet by mouth in the morning and at bedtime.  . pantoprazole (PROTONIX) 40 MG tablet Take 1 tablet (40 mg total) by mouth daily with breakfast.  . [DISCONTINUED] Cholecalciferol (VITAMIN D3 PO) Take 200 mcg by mouth daily.  . [DISCONTINUED] Flaxseed, Linseed, (FLAXSEED OIL PO) Take 1 tablet by mouth daily.   No facility-administered encounter medications on file as of 11/01/2020.   No Known Allergies There are no problems to display for this patient.  Social History   Socioeconomic History  . Marital status: Married    Spouse name: Not on file  . Number of children: 1  . Years of education: Not on file  . Highest education level: Not on file  Occupational History  . Occupation: Chef  Tobacco Use  . Smoking status: Never Smoker  . Smokeless tobacco: Never Used  Vaping Use  . Vaping Use: Never used  Substance and Sexual Activity  . Alcohol use: Yes    Comment: 1 per day  . Drug use: No  . Sexual activity: Yes    Birth control/protection: Inserts    Comment: NUVARING...DECLINED INSURANCE QUESTIONS  Other Topics Concern  . Not on file  Social History Narrative  . Not on file  Social Determinants of Health   Financial Resource Strain: Not on file  Food Insecurity: Not on file  Transportation Needs: Not on file  Physical Activity: Not on file  Stress: Not on file  Social Connections: Not on file  Intimate Partner Violence: Not on file    Linda Lewis's family history includes Alcoholism in her father; Hypertension in her mother.      Objective:    Vitals:   11/01/20 0918  BP: 110/76  Pulse: 76    Physical Exam Well-developed well-nourished female in no acute distress.  Height,  Weight, 127 BMI 21.5  HEENT; nontraumatic normocephalic, EOMI, PE R LA, sclera anicteric. Oropharynx; not examined today Neck; supple, no JVD Cardiovascular; regular rate and rhythm with S1-S2, no murmur rub or gallop Pulmonary; Clear bilaterally Abdomen; soft, there is some tenderness in the epigastrium and high right upper quadrant no guarding or rebound, nondistended, no palpable mass or hepatosplenomegaly, bowel sounds are active Rectal; not done today Skin; benign exam, no jaundice rash or appreciable lesions Extremities; no clubbing cyanosis or edema skin warm and dry Neuro/Psych; alert and oriented x4, grossly nonfocal mood and affect appropriate      Assessment & Plan:   #46 50 year old female with 40-monthhistory of right upper quadrant pain somewhat progressive and worse postprandially there has been some associated nausea but no vomiting Work-up thus far has been unrevealing.  She had recent labs upper abdominal ultrasound and CT of the abdomen pelvis with IV contrast through the ER on 10/24/2020/[facility] all of which were unremarkable.  Etiology of her symptoms is not clear, rule out gastropathy, duodenopathy, peptic ulcer disease.  #2 colon cancer screening-up-to-date with colonoscopy done in September 2021 with removal of 1 hyperplastic polyp otherwise negative exam  Plan; start Protonix 40 mg p.o. every morning AC breakfast Patient will be scheduled for upper endoscopy with Dr. JArdis Hughsin May.  Dr. PHenrene Pastorhas no procedure availability until late June.  Procedure was discussed in detail with patient including indications risks and benefits and she is agreeable to proceed. Further recommendations pending findings at EGD   AHoliday PoconoPA-C 11/01/2020   Cc: No ref. provider found

## 2020-11-01 NOTE — Progress Notes (Signed)
Noted  

## 2020-11-01 NOTE — Patient Instructions (Signed)
If you are age 52 or older, your body mass index should be between 23-30. Your Body mass index is 21.55 kg/m. If this is out of the aforementioned range listed, please consider follow up with your Primary Care Provider.  If you are age 35 or younger, your body mass index should be between 19-25. Your Body mass index is 21.55 kg/m. If this is out of the aformentioned range listed, please consider follow up with your Primary Care Provider.   You have been scheduled for an endoscopy. Please follow written instructions given to you at your visit today. If you use inhalers (even only as needed), please bring them with you on the day of your procedure.  START Pantoprazole 40 mg 1 tablet every morning before breakfast.   Avoid Asprin and NSAID.  Follow up pending the results of your Endoscopy or as needed.   Thank you for entrusting me with your care and choosing [facility].  Amy Esterwood, PA-C

## 2020-11-04 NOTE — Progress Notes (Signed)
I agree with the above note, plan.  Happy to help out.

## 2020-11-12 DIAGNOSIS — R42 Dizziness and giddiness: Secondary | ICD-10-CM | POA: Diagnosis not present

## 2020-11-12 DIAGNOSIS — M9905 Segmental and somatic dysfunction of pelvic region: Secondary | ICD-10-CM | POA: Diagnosis not present

## 2020-11-12 DIAGNOSIS — M9903 Segmental and somatic dysfunction of lumbar region: Secondary | ICD-10-CM | POA: Diagnosis not present

## 2020-11-12 DIAGNOSIS — M9901 Segmental and somatic dysfunction of cervical region: Secondary | ICD-10-CM | POA: Diagnosis not present

## 2020-11-13 ENCOUNTER — Telehealth: Payer: Self-pay | Admitting: Physician Assistant

## 2020-11-13 NOTE — Telephone Encounter (Signed)
Sounds good

## 2020-11-13 NOTE — Telephone Encounter (Signed)
Spoke with the patient. She states her symptoms of lightheadedness and being dizzy started after she began Protonix. Denies rash, diarrhea or edema. She has increased her water intake and feels a little better. By the evening she always feels better. Applauded her increased water intake. She will hold the Protonix tomorrow morning. If her symptoms return despite this, she will resume Protonix. She agrees to call and let me know how she is feeling. Is this acceptable?

## 2020-11-13 NOTE — Telephone Encounter (Signed)
Inbound call from patient stating she has been feeling light headed, dizzy and mouth is dry since she started taking Protonix and wants to know if these could be side effects to the medication.  Please advise.

## 2020-11-13 NOTE — Telephone Encounter (Signed)
Called the patient back. No answer. Left her a message to call me again.

## 2020-11-26 ENCOUNTER — Encounter: Payer: BC Managed Care – PPO | Admitting: Gastroenterology

## 2020-12-06 DIAGNOSIS — M4723 Other spondylosis with radiculopathy, cervicothoracic region: Secondary | ICD-10-CM | POA: Diagnosis not present

## 2020-12-06 DIAGNOSIS — M25511 Pain in right shoulder: Secondary | ICD-10-CM | POA: Diagnosis not present

## 2020-12-06 DIAGNOSIS — M542 Cervicalgia: Secondary | ICD-10-CM | POA: Diagnosis not present

## 2020-12-06 DIAGNOSIS — M25512 Pain in left shoulder: Secondary | ICD-10-CM | POA: Diagnosis not present

## 2020-12-10 DIAGNOSIS — M9901 Segmental and somatic dysfunction of cervical region: Secondary | ICD-10-CM | POA: Diagnosis not present

## 2020-12-10 DIAGNOSIS — M9905 Segmental and somatic dysfunction of pelvic region: Secondary | ICD-10-CM | POA: Diagnosis not present

## 2020-12-10 DIAGNOSIS — M9903 Segmental and somatic dysfunction of lumbar region: Secondary | ICD-10-CM | POA: Diagnosis not present

## 2020-12-10 DIAGNOSIS — R42 Dizziness and giddiness: Secondary | ICD-10-CM | POA: Diagnosis not present

## 2021-01-03 DIAGNOSIS — M542 Cervicalgia: Secondary | ICD-10-CM | POA: Diagnosis not present

## 2021-01-03 DIAGNOSIS — M4723 Other spondylosis with radiculopathy, cervicothoracic region: Secondary | ICD-10-CM | POA: Diagnosis not present

## 2021-01-03 DIAGNOSIS — M25511 Pain in right shoulder: Secondary | ICD-10-CM | POA: Diagnosis not present

## 2021-01-03 DIAGNOSIS — M25512 Pain in left shoulder: Secondary | ICD-10-CM | POA: Diagnosis not present

## 2021-01-30 DIAGNOSIS — M9905 Segmental and somatic dysfunction of pelvic region: Secondary | ICD-10-CM | POA: Diagnosis not present

## 2021-01-30 DIAGNOSIS — M9903 Segmental and somatic dysfunction of lumbar region: Secondary | ICD-10-CM | POA: Diagnosis not present

## 2021-01-30 DIAGNOSIS — M9901 Segmental and somatic dysfunction of cervical region: Secondary | ICD-10-CM | POA: Diagnosis not present

## 2021-01-30 DIAGNOSIS — R42 Dizziness and giddiness: Secondary | ICD-10-CM | POA: Diagnosis not present

## 2021-02-03 ENCOUNTER — Ambulatory Visit (INDEPENDENT_AMBULATORY_CARE_PROVIDER_SITE_OTHER): Payer: BC Managed Care – PPO | Admitting: Nurse Practitioner

## 2021-02-03 ENCOUNTER — Encounter: Payer: Self-pay | Admitting: Nurse Practitioner

## 2021-02-03 ENCOUNTER — Other Ambulatory Visit: Payer: Self-pay

## 2021-02-03 VITALS — BP 116/76 | Ht 64.5 in | Wt 126.0 lb

## 2021-02-03 DIAGNOSIS — R002 Palpitations: Secondary | ICD-10-CM | POA: Diagnosis not present

## 2021-02-03 DIAGNOSIS — Z3044 Encounter for surveillance of vaginal ring hormonal contraceptive device: Secondary | ICD-10-CM | POA: Diagnosis not present

## 2021-02-03 DIAGNOSIS — Z01419 Encounter for gynecological examination (general) (routine) without abnormal findings: Secondary | ICD-10-CM

## 2021-02-03 MED ORDER — ETONOGESTREL-ETHINYL ESTRADIOL 0.12-0.015 MG/24HR VA RING: VAGINAL_RING | VAGINAL | 3 refills | Status: DC

## 2021-02-03 NOTE — Progress Notes (Signed)
   Tamu Malsam 27-Jun-1971 [id number]   History:  50 y.o. G2P1011 presents for annual exam without GYN complaints. Monthly cycle on Nuvaring, occasional hot flashes. Normal pap and mammogram history. Sees a holistic doctor who is managing hypothyroidism. She has noticed palpitations lately that occur most days, last a second, and has no associated symptoms.  Gynecologic History Patient's last menstrual period was 01/19/2021. Period Cycle (Days): 28 Period Duration (Days): 4 Period Pattern: Regular Menstrual Flow: Moderate Dysmenorrhea: (!) Mild Dysmenorrhea Symptoms: Cramping Contraception: NuvaRing vaginal inserts  Health Maintenance Last Pap: 01/29/2020 Results were: Normal, 5-year repeat Last mammogram: 10/23/2020. Results were: Normal Last colonoscopy: 04/09/2020. Results were: benign polyp x1 Last Dexa: Not indicated  Past medical history, past surgical history, family history and social history were all reviewed and documented in the EPIC chart. 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 31 yo daughter, engaged with plans to married end of 2022.   ROS:  A ROS was performed and pertinent positives and negatives are included.  Exam:  Vitals:   02/03/21 1000  BP: 116/76  Weight: 126 lb (57.2 kg)  Height: 5' 4.5" (1.638 m)    Body mass index is 21.29 kg/m.  General appearance:  Normal Thyroid:  Symmetrical, normal in size, without palpable masses or nodularity. Respiratory  Auscultation:  Clear without wheezing or rhonchi Cardiovascular  Auscultation:  Regular rate, without rubs, murmurs or gallops  Edema/varicosities:  Not grossly evident Abdominal  Soft,nontender, without masses, guarding or rebound.  Liver/spleen:  No organomegaly noted  Hernia:  None appreciated  Skin  Inspection:  Grossly normal   Breasts: Examined lying and sitting.   Right: Without masses, retractions, discharge or axillary adenopathy.   Left: Without masses,  retractions, discharge or axillary adenopathy. Gentitourinary   Inguinal/mons:  Normal without inguinal adenopathy  External genitalia:  Normal  BUS/Urethra/Skene's glands:  Normal  Vagina:  Normal. Contraceptive ring in place  Cervix:  Normal  Uterus:  Anteverted, normal in size, shape and contour.  Midline and mobile  Adnexa/parametria:     Rt: Without masses or tenderness.   Lt: Without masses or tenderness.  Anus and perineum: Normal  Digital rectal exam: Normal sphincter tone without palpated masses or tenderness  Assessment/Plan:  50 y.o. [id number] for annual exam.   Well female exam with routine gynecological exam - Education provided on SBEs, importance of preventative screenings, current guidelines, high calcium diet, regular exercise, and multivitamin daily. Labs done with holistic provider.   Encounter for surveillance of vaginal ring hormonal contraceptive device - Plan: etonogestrel-ethinyl estradiol (NUVARING) 0.12-0.015 MG/24HR vaginal ring monthly. Refill x 1 year provided.  Palpitations - She has noticed palpitations lately that occur most days, last a second, and has no associated symptoms. She has had more anxiety lately. She is unsure if it occurs at rest or with activity. Recommend monitoring and if they persist or she becomes symptomatic she will be evaluated.   Screening for cervical cancer - Normal Pap history.  Will repeat at 5-year interval per guidelines.  Screening for breast cancer - Normal mammogram history.  Continue annual screenings.  Normal breast exam today.  Screening for colon cancer - 2021 colonoscopy. Will repeat at GI's recommended interval.   Follow up in 1 year for annual        [place], 10:19 AM 02/03/2021

## 2021-03-05 DIAGNOSIS — M9901 Segmental and somatic dysfunction of cervical region: Secondary | ICD-10-CM | POA: Diagnosis not present

## 2021-03-05 DIAGNOSIS — M9903 Segmental and somatic dysfunction of lumbar region: Secondary | ICD-10-CM | POA: Diagnosis not present

## 2021-03-05 DIAGNOSIS — M9905 Segmental and somatic dysfunction of pelvic region: Secondary | ICD-10-CM | POA: Diagnosis not present

## 2021-03-05 DIAGNOSIS — M9902 Segmental and somatic dysfunction of thoracic region: Secondary | ICD-10-CM | POA: Diagnosis not present

## 2021-03-19 ENCOUNTER — Other Ambulatory Visit: Payer: Self-pay | Admitting: Nurse Practitioner

## 2021-03-19 DIAGNOSIS — Z3044 Encounter for surveillance of vaginal ring hormonal contraceptive device: Secondary | ICD-10-CM

## 2021-04-07 DIAGNOSIS — M9905 Segmental and somatic dysfunction of pelvic region: Secondary | ICD-10-CM | POA: Diagnosis not present

## 2021-04-07 DIAGNOSIS — M9903 Segmental and somatic dysfunction of lumbar region: Secondary | ICD-10-CM | POA: Diagnosis not present

## 2021-04-07 DIAGNOSIS — M9902 Segmental and somatic dysfunction of thoracic region: Secondary | ICD-10-CM | POA: Diagnosis not present

## 2021-04-07 DIAGNOSIS — M25511 Pain in right shoulder: Secondary | ICD-10-CM | POA: Diagnosis not present

## 2021-04-07 DIAGNOSIS — M25651 Stiffness of right hip, not elsewhere classified: Secondary | ICD-10-CM | POA: Diagnosis not present

## 2021-04-07 DIAGNOSIS — M25652 Stiffness of left hip, not elsewhere classified: Secondary | ICD-10-CM | POA: Diagnosis not present

## 2021-04-07 DIAGNOSIS — M7918 Myalgia, other site: Secondary | ICD-10-CM | POA: Diagnosis not present

## 2021-04-07 DIAGNOSIS — M9901 Segmental and somatic dysfunction of cervical region: Secondary | ICD-10-CM | POA: Diagnosis not present

## 2021-05-05 DIAGNOSIS — M9905 Segmental and somatic dysfunction of pelvic region: Secondary | ICD-10-CM | POA: Diagnosis not present

## 2021-05-05 DIAGNOSIS — M9901 Segmental and somatic dysfunction of cervical region: Secondary | ICD-10-CM | POA: Diagnosis not present

## 2021-05-05 DIAGNOSIS — M9903 Segmental and somatic dysfunction of lumbar region: Secondary | ICD-10-CM | POA: Diagnosis not present

## 2021-05-05 DIAGNOSIS — M9902 Segmental and somatic dysfunction of thoracic region: Secondary | ICD-10-CM | POA: Diagnosis not present

## 2021-06-02 DIAGNOSIS — M9905 Segmental and somatic dysfunction of pelvic region: Secondary | ICD-10-CM | POA: Diagnosis not present

## 2021-06-02 DIAGNOSIS — M9901 Segmental and somatic dysfunction of cervical region: Secondary | ICD-10-CM | POA: Diagnosis not present

## 2021-06-02 DIAGNOSIS — M9903 Segmental and somatic dysfunction of lumbar region: Secondary | ICD-10-CM | POA: Diagnosis not present

## 2021-06-02 DIAGNOSIS — M9902 Segmental and somatic dysfunction of thoracic region: Secondary | ICD-10-CM | POA: Diagnosis not present

## 2021-06-06 DIAGNOSIS — J069 Acute upper respiratory infection, unspecified: Secondary | ICD-10-CM | POA: Diagnosis not present

## 2021-07-01 DIAGNOSIS — M9903 Segmental and somatic dysfunction of lumbar region: Secondary | ICD-10-CM | POA: Diagnosis not present

## 2021-07-01 DIAGNOSIS — M9905 Segmental and somatic dysfunction of pelvic region: Secondary | ICD-10-CM | POA: Diagnosis not present

## 2021-07-01 DIAGNOSIS — M9901 Segmental and somatic dysfunction of cervical region: Secondary | ICD-10-CM | POA: Diagnosis not present

## 2021-07-01 DIAGNOSIS — M9902 Segmental and somatic dysfunction of thoracic region: Secondary | ICD-10-CM | POA: Diagnosis not present

## 2021-09-29 ENCOUNTER — Other Ambulatory Visit: Payer: Self-pay | Admitting: Nurse Practitioner

## 2021-09-29 DIAGNOSIS — Z1231 Encounter for screening mammogram for malignant neoplasm of breast: Secondary | ICD-10-CM

## 2021-10-21 DIAGNOSIS — M9901 Segmental and somatic dysfunction of cervical region: Secondary | ICD-10-CM | POA: Diagnosis not present

## 2021-10-21 DIAGNOSIS — M9902 Segmental and somatic dysfunction of thoracic region: Secondary | ICD-10-CM | POA: Diagnosis not present

## 2021-10-21 DIAGNOSIS — M9905 Segmental and somatic dysfunction of pelvic region: Secondary | ICD-10-CM | POA: Diagnosis not present

## 2021-10-21 DIAGNOSIS — M9903 Segmental and somatic dysfunction of lumbar region: Secondary | ICD-10-CM | POA: Diagnosis not present

## 2021-10-27 ENCOUNTER — Ambulatory Visit
Admission: RE | Admit: 2021-10-27 | Discharge: 2021-10-27 | Disposition: A | Discharge status: Home / Self Care | Payer: BC Managed Care – PPO | Source: Ambulatory Visit | Attending: Nurse Practitioner | Admitting: Nurse Practitioner

## 2021-10-27 DIAGNOSIS — Z1231 Encounter for screening mammogram for malignant neoplasm of breast: Secondary | ICD-10-CM | POA: Diagnosis not present

## 2022-01-07 DIAGNOSIS — M9903 Segmental and somatic dysfunction of lumbar region: Secondary | ICD-10-CM | POA: Diagnosis not present

## 2022-01-07 DIAGNOSIS — M9902 Segmental and somatic dysfunction of thoracic region: Secondary | ICD-10-CM | POA: Diagnosis not present

## 2022-01-07 DIAGNOSIS — M9905 Segmental and somatic dysfunction of pelvic region: Secondary | ICD-10-CM | POA: Diagnosis not present

## 2022-01-07 DIAGNOSIS — M9901 Segmental and somatic dysfunction of cervical region: Secondary | ICD-10-CM | POA: Diagnosis not present

## 2022-03-02 ENCOUNTER — Other Ambulatory Visit: Payer: Self-pay | Admitting: Nurse Practitioner

## 2022-03-02 DIAGNOSIS — Z3044 Encounter for surveillance of vaginal ring hormonal contraceptive device: Secondary | ICD-10-CM

## 2022-03-03 ENCOUNTER — Other Ambulatory Visit: Payer: Self-pay

## 2022-03-03 DIAGNOSIS — Z3044 Encounter for surveillance of vaginal ring hormonal contraceptive device: Secondary | ICD-10-CM

## 2022-03-03 MED ORDER — ETONOGESTREL-ETHINYL ESTRADIOL 0.12-0.015 MG/24HR VA RING: VAGINAL_RING | VAGINAL | 0 refills | Status: DC

## 2022-03-03 NOTE — Telephone Encounter (Signed)
Pt calling to request refills on ring.  Last AEX 02/03/21--scheduled for 03/10/22. Last mammo- 10/27/21-neg birads 1

## 2022-03-04 ENCOUNTER — Other Ambulatory Visit: Payer: Self-pay | Admitting: *Deleted

## 2022-03-04 NOTE — Telephone Encounter (Signed)
Refill encounter opened.   Request for refill already completed on 03/03/22 for Nuvaring.   Encounter closed.

## 2022-03-10 ENCOUNTER — Ambulatory Visit (INDEPENDENT_AMBULATORY_CARE_PROVIDER_SITE_OTHER): Payer: BC Managed Care – PPO | Admitting: Nurse Practitioner

## 2022-03-10 ENCOUNTER — Encounter: Payer: Self-pay | Admitting: Nurse Practitioner

## 2022-03-10 VITALS — BP 100/62 | HR 70 | Resp 12 | Ht 64.75 in | Wt 124.0 lb

## 2022-03-10 DIAGNOSIS — Z3044 Encounter for surveillance of vaginal ring hormonal contraceptive device: Secondary | ICD-10-CM

## 2022-03-10 DIAGNOSIS — Z01419 Encounter for gynecological examination (general) (routine) without abnormal findings: Secondary | ICD-10-CM

## 2022-03-10 MED ORDER — ETONOGESTREL-ETHINYL ESTRADIOL 0.12-0.015 MG/24HR VA RING: VAGINAL_RING | VAGINAL | 3 refills | Status: DC

## 2022-03-10 NOTE — Progress Notes (Signed)
   Linda Lewis 1970-11-19 [id number]   History:  51 y.o. G2P1011 presents for annual exam without GYN complaints. Monthly cycle on Nuvaring, occasional hot flashes. Normal pap and mammogram history. Sees a holistic doctor who is managing hypothyroidism.  Gynecologic History Patient's last menstrual period was 02/24/2022. Period Cycle (Days): 30 Period Duration (Days): 2-3 Period Pattern: Regular Menstrual Flow: Moderate Menstrual Control: Maxi pad Menstrual Control Change Freq (Hours): changes pad every 2 hours Dysmenorrhea: (!) Moderate Dysmenorrhea Symptoms: Cramping, Other (Comment) (lower back pain) Contraception: NuvaRing vaginal inserts Sexually active: Yes  Health Maintenance Last Pap: 01/29/2020 Results were: Normal, 5-year repeat Last mammogram: 10/27/2021. Results were: Normal Last colonoscopy: 04/09/2020. Results were: benign polyp x1 Last Dexa: Not indicated  Past medical history, past surgical history, family history and social history were all reviewed and documented in the EPIC chart. 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 48 yo daughter, getting married next month.   ROS:  A ROS was performed and pertinent positives and negatives are included.  Exam:  Vitals:   03/10/22 0955  BP: 100/62  Pulse: 70  Resp: 12  Weight: 124 lb (56.2 kg)  Height: 5' 4.75" (1.645 m)     Body mass index is 20.79 kg/m.  General appearance:  Normal Thyroid:  Symmetrical, normal in size, without palpable masses or nodularity. Respiratory  Auscultation:  Clear without wheezing or rhonchi Cardiovascular  Auscultation:  Regular rate, without rubs, murmurs or gallops  Edema/varicosities:  Not grossly evident Abdominal  Soft,nontender, without masses, guarding or rebound.  Liver/spleen:  No organomegaly noted  Hernia:  None appreciated  Skin  Inspection:  Grossly normal   Breasts: Examined lying and sitting.   Right: Without masses,  retractions, discharge or axillary adenopathy.   Left: Without masses, retractions, discharge or axillary adenopathy. Genitourinary   Inguinal/mons:  Normal without inguinal adenopathy  External genitalia:  Normal appearing vulva with no masses, tenderness, or lesions  BUS/Urethra/Skene's glands:  Normal  Vagina:  Normal appearing with normal color and discharge, no lesions  Cervix:  Normal appearing without discharge or lesions  Uterus:  Normal in size, shape and contour.  Midline and mobile, nontender  Adnexa/parametria:     Rt: Normal in size, without masses or tenderness.   Lt: Normal in size, without masses or tenderness.  Anus and perineum: Normal  Digital rectal exam: Normal sphincter tone without palpated masses or tenderness  Patient informed chaperone available to be present for breast and pelvic exam. Patient has requested no chaperone to be present. Patient has been advised what will be completed during breast and pelvic exam.   Assessment/Plan:  51 y.o. G0P1011 for annual exam.   Well female exam with routine gynecological exam - Education provided on SBEs, importance of preventative screenings, current guidelines, high calcium diet, regular exercise, and multivitamin daily. Labs done elsewhere.  Encounter for surveillance of vaginal ring hormonal contraceptive device - Plan: etonogestrel-ethinyl estradiol (NUVARING) 0.12-0.015 MG/24HR vaginal ring monthly. Refill x 1 year provided.  Screening for cervical cancer - Normal Pap history.  Will repeat at 5-year interval per guidelines.  Screening for breast cancer - Normal mammogram history.  Continue annual screenings.  Normal breast exam today.  Screening for colon cancer - 2021 colonoscopy. Will repeat at GI's recommended interval.   Follow up in 1 year for annual.       Tamela Gammon [facility], 10:08 AM 03/10/2022

## 2022-03-29 ENCOUNTER — Other Ambulatory Visit: Payer: Self-pay | Admitting: Nurse Practitioner

## 2022-03-29 DIAGNOSIS — Z3044 Encounter for surveillance of vaginal ring hormonal contraceptive device: Secondary | ICD-10-CM

## 2022-08-12 DIAGNOSIS — M9901 Segmental and somatic dysfunction of cervical region: Secondary | ICD-10-CM | POA: Diagnosis not present

## 2022-08-12 DIAGNOSIS — M9903 Segmental and somatic dysfunction of lumbar region: Secondary | ICD-10-CM | POA: Diagnosis not present

## 2022-08-12 DIAGNOSIS — M9905 Segmental and somatic dysfunction of pelvic region: Secondary | ICD-10-CM | POA: Diagnosis not present

## 2022-08-12 DIAGNOSIS — M9902 Segmental and somatic dysfunction of thoracic region: Secondary | ICD-10-CM | POA: Diagnosis not present

## 2022-09-01 DIAGNOSIS — M9901 Segmental and somatic dysfunction of cervical region: Secondary | ICD-10-CM | POA: Diagnosis not present

## 2022-09-01 DIAGNOSIS — M9903 Segmental and somatic dysfunction of lumbar region: Secondary | ICD-10-CM | POA: Diagnosis not present

## 2022-09-01 DIAGNOSIS — M9905 Segmental and somatic dysfunction of pelvic region: Secondary | ICD-10-CM | POA: Diagnosis not present

## 2022-09-01 DIAGNOSIS — M9902 Segmental and somatic dysfunction of thoracic region: Secondary | ICD-10-CM | POA: Diagnosis not present

## 2022-09-15 DIAGNOSIS — H01119 Allergic dermatitis of unspecified eye, unspecified eyelid: Secondary | ICD-10-CM | POA: Diagnosis not present

## 2022-09-28 ENCOUNTER — Other Ambulatory Visit: Payer: Self-pay | Admitting: Nurse Practitioner

## 2022-09-28 DIAGNOSIS — Z1231 Encounter for screening mammogram for malignant neoplasm of breast: Secondary | ICD-10-CM

## 2022-10-13 DIAGNOSIS — M9903 Segmental and somatic dysfunction of lumbar region: Secondary | ICD-10-CM | POA: Diagnosis not present

## 2022-10-13 DIAGNOSIS — M9905 Segmental and somatic dysfunction of pelvic region: Secondary | ICD-10-CM | POA: Diagnosis not present

## 2022-10-13 DIAGNOSIS — M9901 Segmental and somatic dysfunction of cervical region: Secondary | ICD-10-CM | POA: Diagnosis not present

## 2022-10-13 DIAGNOSIS — M9902 Segmental and somatic dysfunction of thoracic region: Secondary | ICD-10-CM | POA: Diagnosis not present

## 2022-11-02 DIAGNOSIS — L259 Unspecified contact dermatitis, unspecified cause: Secondary | ICD-10-CM | POA: Diagnosis not present

## 2022-11-05 DIAGNOSIS — L2389 Allergic contact dermatitis due to other agents: Secondary | ICD-10-CM | POA: Diagnosis not present

## 2022-11-05 DIAGNOSIS — L230 Allergic contact dermatitis due to metals: Secondary | ICD-10-CM | POA: Diagnosis not present

## 2022-11-17 ENCOUNTER — Ambulatory Visit: Payer: BC Managed Care – PPO

## 2022-11-25 DIAGNOSIS — M9901 Segmental and somatic dysfunction of cervical region: Secondary | ICD-10-CM | POA: Diagnosis not present

## 2022-11-25 DIAGNOSIS — M9905 Segmental and somatic dysfunction of pelvic region: Secondary | ICD-10-CM | POA: Diagnosis not present

## 2022-11-25 DIAGNOSIS — M9903 Segmental and somatic dysfunction of lumbar region: Secondary | ICD-10-CM | POA: Diagnosis not present

## 2022-11-25 DIAGNOSIS — M9902 Segmental and somatic dysfunction of thoracic region: Secondary | ICD-10-CM | POA: Diagnosis not present

## 2022-11-30 ENCOUNTER — Other Ambulatory Visit: Payer: Self-pay

## 2022-11-30 DIAGNOSIS — Z3044 Encounter for surveillance of vaginal ring hormonal contraceptive device: Secondary | ICD-10-CM

## 2022-11-30 MED ORDER — ETONOGESTREL-ETHINYL ESTRADIOL 0.12-0.015 MG/24HR VA RING: VAGINAL_RING | VAGINAL | 1 refills | Status: DC

## 2022-11-30 NOTE — Telephone Encounter (Signed)
LDVM on machine per DPR notifying pt that new rx for refills was sent to pharmacy.

## 2022-11-30 NOTE — Telephone Encounter (Signed)
Pt calling to report no refills left of nuvaring. Pt advised that I will call pharmacy to check for her, she voiced understanding/appreciation.  Last AEX 03/10/2022--03/17/2023 Last mammo 10/27/2021-neg birads 1, scheduled for 12/15/2022.  Rx pend.

## 2022-12-15 ENCOUNTER — Ambulatory Visit
Admission: RE | Admit: 2022-12-15 | Discharge: 2022-12-15 | Disposition: A | Discharge status: Home / Self Care | Payer: BC Managed Care – PPO | Source: Ambulatory Visit | Attending: Nurse Practitioner | Admitting: Nurse Practitioner

## 2022-12-15 DIAGNOSIS — Z1231 Encounter for screening mammogram for malignant neoplasm of breast: Secondary | ICD-10-CM

## 2022-12-22 ENCOUNTER — Other Ambulatory Visit: Payer: Self-pay | Admitting: Nurse Practitioner

## 2022-12-22 DIAGNOSIS — L2389 Allergic contact dermatitis due to other agents: Secondary | ICD-10-CM | POA: Diagnosis not present

## 2022-12-22 DIAGNOSIS — L230 Allergic contact dermatitis due to metals: Secondary | ICD-10-CM | POA: Diagnosis not present

## 2022-12-22 DIAGNOSIS — L2089 Other atopic dermatitis: Secondary | ICD-10-CM | POA: Diagnosis not present

## 2022-12-22 DIAGNOSIS — Z1231 Encounter for screening mammogram for malignant neoplasm of breast: Secondary | ICD-10-CM

## 2022-12-23 DIAGNOSIS — M9901 Segmental and somatic dysfunction of cervical region: Secondary | ICD-10-CM | POA: Diagnosis not present

## 2022-12-23 DIAGNOSIS — M9903 Segmental and somatic dysfunction of lumbar region: Secondary | ICD-10-CM | POA: Diagnosis not present

## 2022-12-23 DIAGNOSIS — M9905 Segmental and somatic dysfunction of pelvic region: Secondary | ICD-10-CM | POA: Diagnosis not present

## 2022-12-23 DIAGNOSIS — M9902 Segmental and somatic dysfunction of thoracic region: Secondary | ICD-10-CM | POA: Diagnosis not present

## 2023-02-16 DIAGNOSIS — M9903 Segmental and somatic dysfunction of lumbar region: Secondary | ICD-10-CM | POA: Diagnosis not present

## 2023-02-16 DIAGNOSIS — M9902 Segmental and somatic dysfunction of thoracic region: Secondary | ICD-10-CM | POA: Diagnosis not present

## 2023-02-16 DIAGNOSIS — M9901 Segmental and somatic dysfunction of cervical region: Secondary | ICD-10-CM | POA: Diagnosis not present

## 2023-02-16 DIAGNOSIS — M9905 Segmental and somatic dysfunction of pelvic region: Secondary | ICD-10-CM | POA: Diagnosis not present

## 2023-03-17 ENCOUNTER — Encounter: Payer: Self-pay | Admitting: Nurse Practitioner

## 2023-03-17 ENCOUNTER — Ambulatory Visit (INDEPENDENT_AMBULATORY_CARE_PROVIDER_SITE_OTHER): Payer: BC Managed Care – PPO | Admitting: Nurse Practitioner

## 2023-03-17 VITALS — BP 110/64 | HR 75 | Ht 64.0 in | Wt 127.8 lb

## 2023-03-17 DIAGNOSIS — Z8349 Family history of other endocrine, nutritional and metabolic diseases: Secondary | ICD-10-CM

## 2023-03-17 DIAGNOSIS — R7989 Other specified abnormal findings of blood chemistry: Secondary | ICD-10-CM | POA: Diagnosis not present

## 2023-03-17 DIAGNOSIS — Z01419 Encounter for gynecological examination (general) (routine) without abnormal findings: Secondary | ICD-10-CM | POA: Diagnosis not present

## 2023-03-17 DIAGNOSIS — Z3044 Encounter for surveillance of vaginal ring hormonal contraceptive device: Secondary | ICD-10-CM

## 2023-03-17 MED ORDER — ETONOGESTREL-ETHINYL ESTRADIOL 0.12-0.015 MG/24HR VA RING: VAGINAL_RING | VAGINAL | 3 refills | Status: DC

## 2023-03-17 NOTE — Progress Notes (Signed)
Linda Lewis [birth year] [id number]   History:  52 y.o. G2P1011 presents for annual exam. Monthly cycle on Nuvaring, occasional hot flashes. Taking black cohosh with some improvement. Normal pap and mammogram history. Reports history of abnormal TSH. Was seeing holistic provider previously but no longer seeing. Levels normalized.   Gynecologic History Patient's last menstrual period was 02/28/2023. Period Cycle (Days): 28 Period Duration (Days): 3 Period Pattern: Regular Menstrual Flow: Light Menstrual Control: Thin pad Menstrual Control Change Freq (Hours): 6 Dysmenorrhea: (!) Mild Dysmenorrhea Symptoms: Cramping, Headache Contraception: NuvaRing vaginal inserts Sexually active: Yes  Health Maintenance Last Pap: 01/29/2020 Results were: Normal neg HPV, 5-year repeat Last mammogram: 12/15/2022. Results were: Normal Last colonoscopy: 04/09/2020. Results were: benign polyp x1 Last Dexa: Not indicated  Past medical history, past surgical history, family history and social history were all reviewed and documented in the EPIC chart. 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   ROS:  A ROS was performed and pertinent positives and negatives are included.  Exam:  Vitals:   03/17/23 1414  BP: 110/64  Pulse: 75  SpO2: 100%  Weight: 127 lb 12.8 oz (58 kg)  Height: 5\' 4"  (1.626 m)      Body mass index is 21.94 kg/m.  General appearance:  Normal Thyroid:  Symmetrical, normal in size, without palpable masses or nodularity. Respiratory  Auscultation:  Clear without wheezing or rhonchi Cardiovascular  Auscultation:  Regular rate, without rubs, murmurs or gallops  Edema/varicosities:  Not grossly evident Abdominal  Soft,nontender, without masses, guarding or rebound.  Liver/spleen:  No organomegaly noted  Hernia:  None appreciated  Skin  Inspection:  Grossly normal   Breasts: Examined lying and sitting.   Right: Without masses,  retractions, discharge or axillary adenopathy.   Left: Without masses, retractions, discharge or axillary adenopathy. Genitourinary   Inguinal/mons:  Normal without inguinal adenopathy  External genitalia:  Normal appearing vulva with no masses, tenderness, or lesions  BUS/Urethra/Skene's glands:  Normal  Vagina:  Normal appearing with normal color and discharge, no lesions  Cervix:  Normal appearing without discharge or lesions  Uterus:  Normal in size, shape and contour.  Midline and mobile, nontender  Adnexa/parametria:     Rt: Normal in size, without masses or tenderness.   Lt: Normal in size, without masses or tenderness.  Anus and perineum: Normal  Digital rectal exam: Not indicated  Patient informed chaperone available to be present for breast and pelvic exam. Patient has requested no chaperone to be present. Patient has been advised what will be completed during breast and pelvic exam.   Assessment/Plan:  52 y.o. G0P1011 for annual exam.   Well female exam with routine gynecological exam - Plan: CBC with Differential/Platelet, Comprehensive metabolic panel. Education provided on SBEs, importance of preventative screenings, current guidelines, high calcium diet, regular exercise, and multivitamin daily. Requesting labs today. Plans to establish with PCP.   Encounter for surveillance of vaginal ring hormonal contraceptive device - Plan: etonogestrel-ethinyl estradiol (NUVARING) 0.12-0.015 MG/24HR vaginal ring monthly. Refill x 1 year provided.  Abnormal TSH - Plan: Thyroid Panel With TSH  Family history of thyroid disease in mother - Plan: Thyroid Panel With TSH  Screening for cervical cancer - Normal Pap history.  Will repeat at 5-year interval per guidelines.  Screening for breast cancer - Normal mammogram history.  Continue annual screenings.  Normal breast exam today.  Screening for colon cancer - 2021 colonoscopy. Will repeat at GI's recommended interval.   Follow up in 1  year for annual.       Olivia Mackie [facility], 2:38 PM 03/17/2023

## 2023-03-18 LAB — THYROID PANEL WITH TSH
Free Thyroxine Index: 1.8 (ref 1.4–3.8)
T3 Uptake: 27 % (ref 22–35)
T4, Total: 6.8 ug/dL (ref 5.1–11.9)
TSH: 0.47 m[IU]/L

## 2023-03-18 LAB — CBC WITH DIFFERENTIAL/PLATELET
Absolute Monocytes: 339 {cells}/uL (ref 200–950)
Basophils Absolute: 39 {cells}/uL (ref 0–200)
Basophils Relative: 1 %
Eosinophils Absolute: 70 {cells}/uL (ref 15–500)
Eosinophils Relative: 1.8 %
HCT: 38.7 % (ref 35.0–45.0)
Hemoglobin: 13 g/dL (ref 11.7–15.5)
Lymphs Abs: 1143 {cells}/uL (ref 850–3900)
MCH: 33 pg (ref 27.0–33.0)
MCHC: 33.6 g/dL (ref 32.0–36.0)
MCV: 98.2 fL (ref 80.0–100.0)
MPV: 10.5 fL (ref 7.5–12.5)
Monocytes Relative: 8.7 %
Neutro Abs: 2309 {cells}/uL (ref 1500–7800)
Neutrophils Relative %: 59.2 %
Platelets: 219 10*3/uL (ref 140–400)
RBC: 3.94 10*6/uL (ref 3.80–5.10)
RDW: 11.7 % (ref 11.0–15.0)
Total Lymphocyte: 29.3 %
WBC: 3.9 10*3/uL (ref 3.8–10.8)

## 2023-03-18 LAB — COMPREHENSIVE METABOLIC PANEL
AG Ratio: 1.6 (calc) (ref 1.0–2.5)
ALT: 15 U/L (ref 6–29)
AST: 15 U/L (ref 10–35)
Albumin: 3.9 g/dL (ref 3.6–5.1)
Alkaline phosphatase (APISO): 68 U/L (ref 37–153)
BUN: 10 mg/dL (ref 7–25)
CO2: 25 mmol/L (ref 20–32)
Calcium: 8.9 mg/dL (ref 8.6–10.4)
Chloride: 105 mmol/L (ref 98–110)
Creat: 0.9 mg/dL (ref 0.50–1.03)
Globulin: 2.5 g/dL (ref 1.9–3.7)
Glucose, Bld: 68 mg/dL (ref 65–99)
Potassium: 3.9 mmol/L (ref 3.5–5.3)
Sodium: 139 mmol/L (ref 135–146)
Total Bilirubin: 0.3 mg/dL (ref 0.2–1.2)
Total Protein: 6.4 g/dL (ref 6.1–8.1)

## 2023-03-30 IMAGING — MG MM DIGITAL SCREENING BILAT W/ TOMO AND CAD
8 series · 9 of 24 positions shown · non-contrast
Comparison: Previous exam(s).

CLINICAL DATA: Screening.

EXAM:
DIGITAL SCREENING BILATERAL MAMMOGRAM WITH TOMOSYNTHESIS AND CAD
TECHNIQUE: Bilateral screening digital craniocaudal and mediolateral oblique
mammograms were obtained. Bilateral screening digital breast
tomosynthesis was performed. The images were evaluated with
computer-aided detection.

[L CC synth-2D]
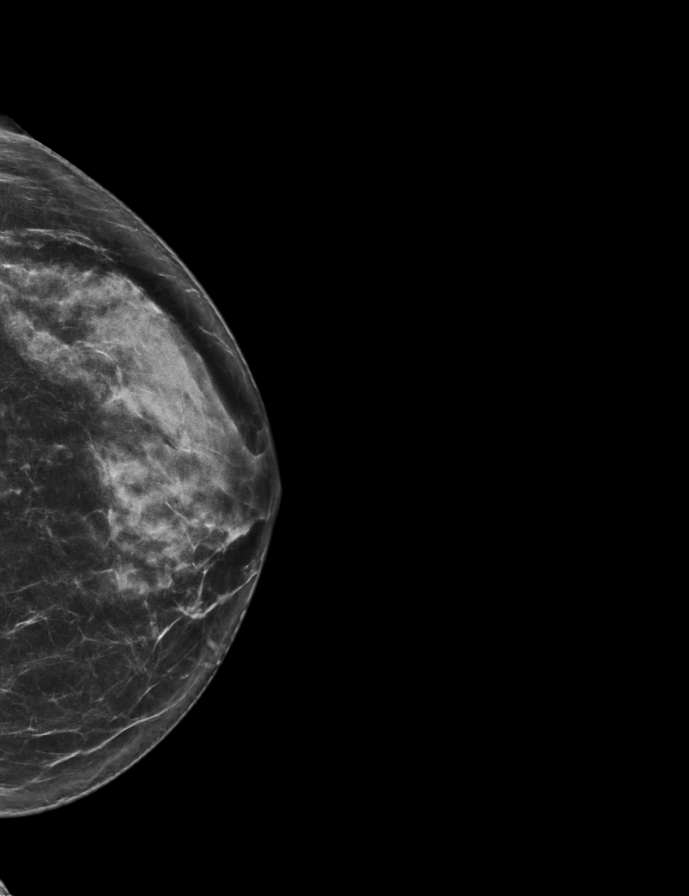

[R MLO synth-2D]
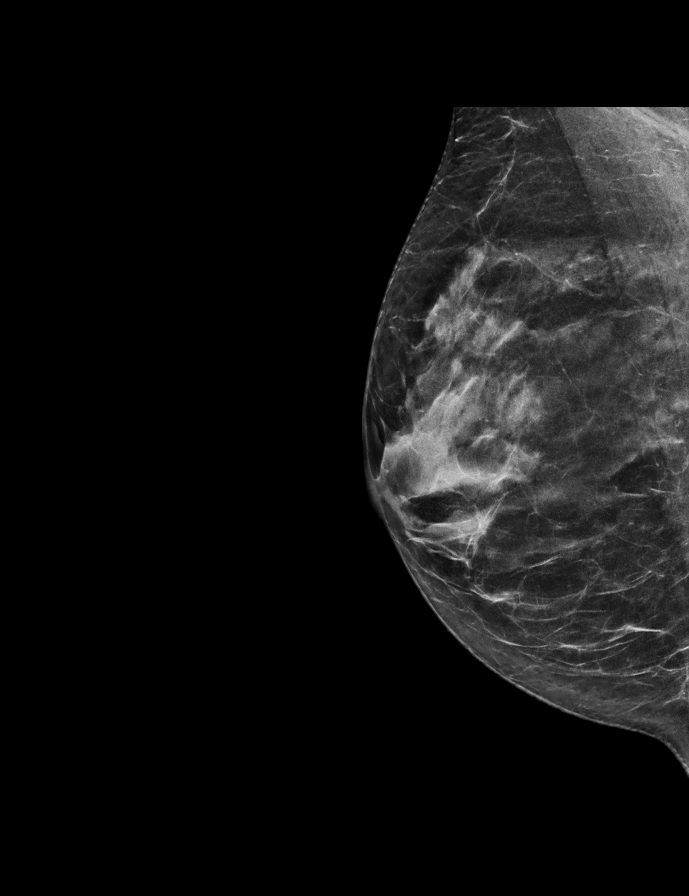

[R CC synth-2D]
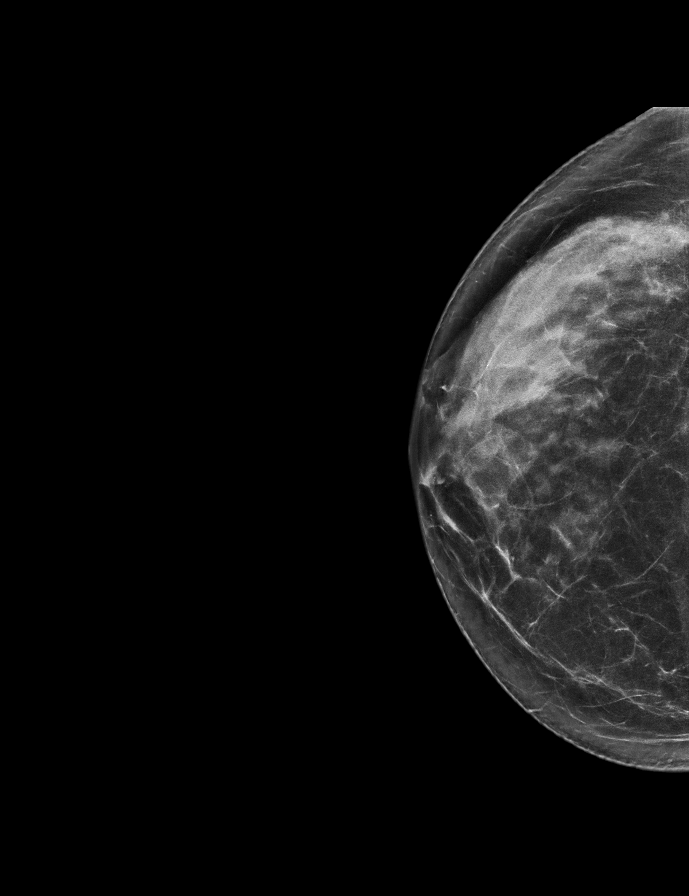

[L MLO synth-2D]
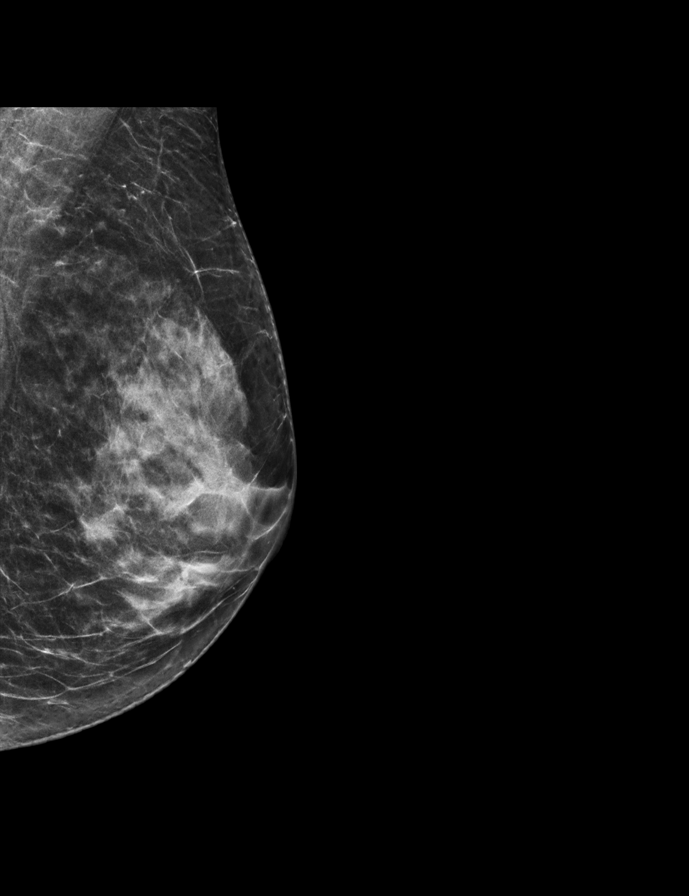

[L CC tomo · 2 of 64 frames shown]
[frame 21/64]
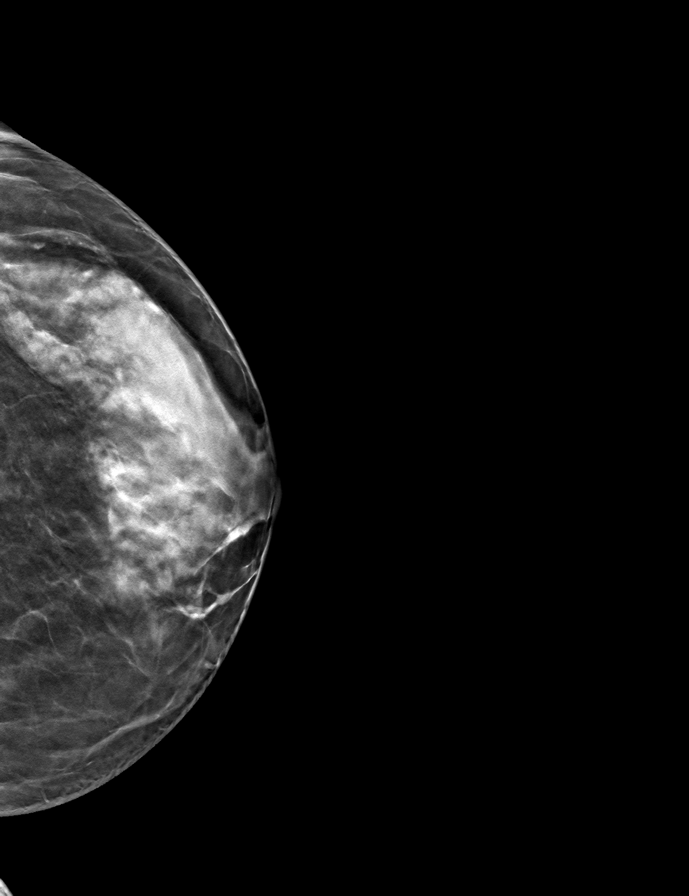
[frame 33/64]
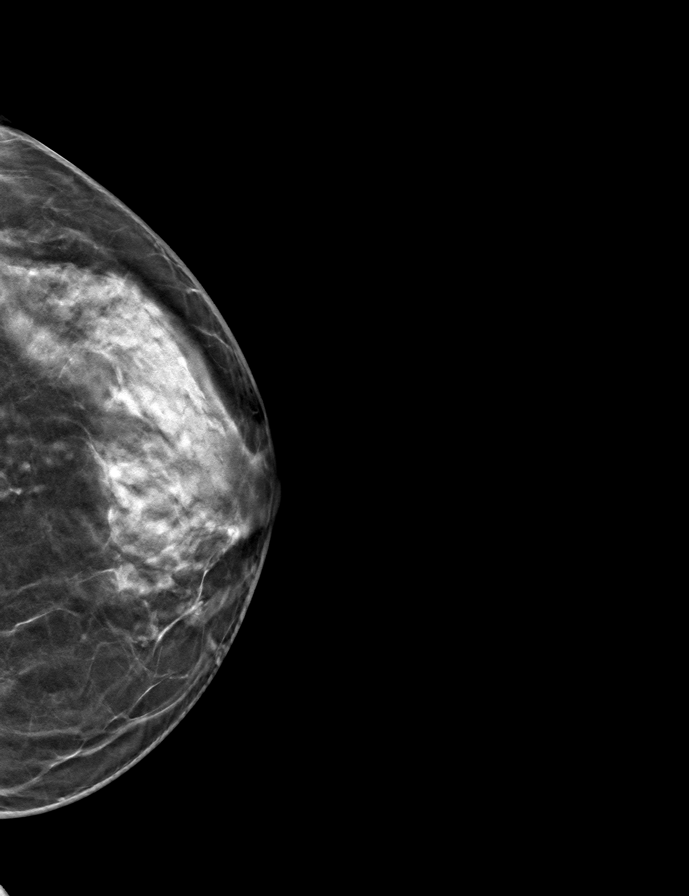

[R MLO tomo · tomo slice 31/61.0]
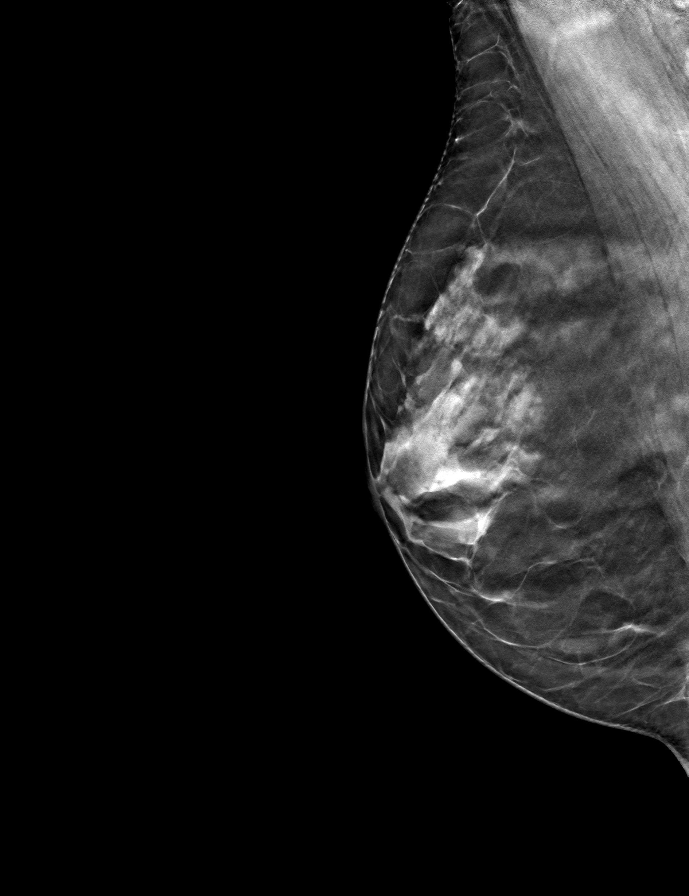

[L MLO tomo · tomo slice 29/58.0]
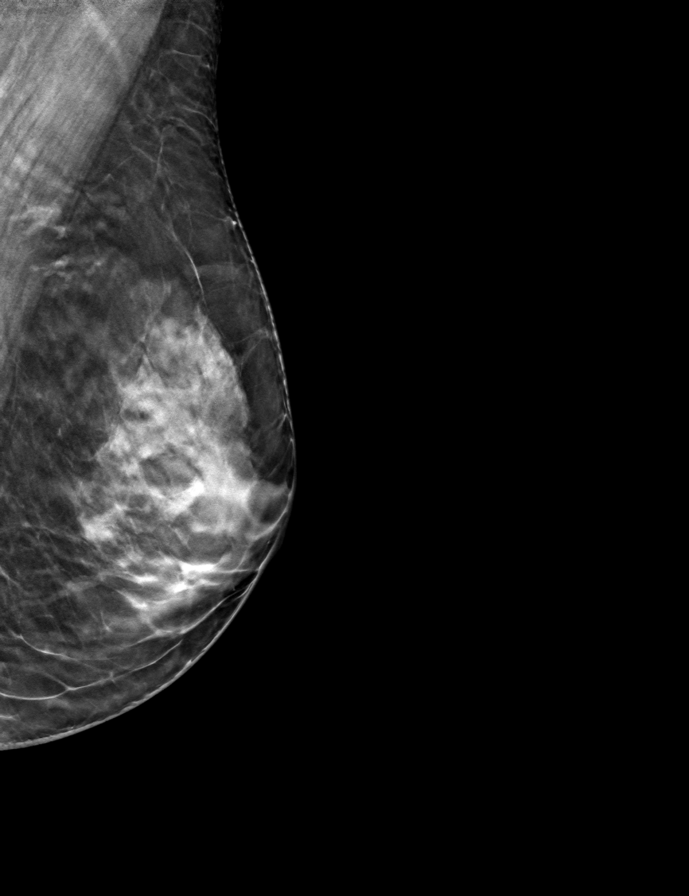

[R CC tomo · tomo slice 33/66.0]
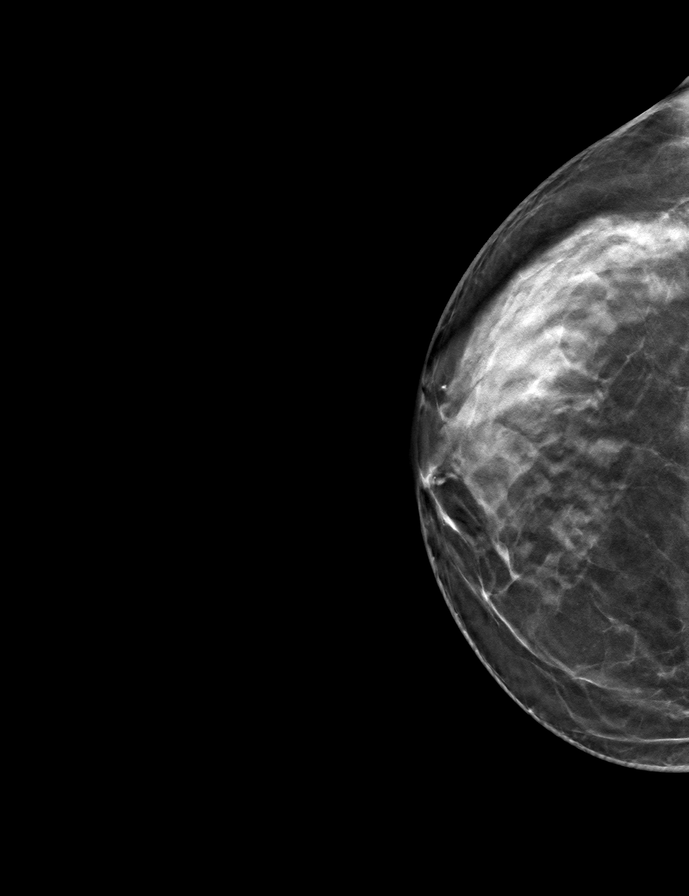

[9 of 24 positions shown; findings below may reference images not displayed]

ACR Breast Density Category c: The breast tissue is heterogeneously
dense, which may obscure small masses.
FINDINGS: There are no findings suspicious for malignancy. The images were
evaluated with computer-aided detection.
IMPRESSION: No mammographic evidence of malignancy. A result letter of this
screening mammogram will be mailed directly to the patient.

RECOMMENDATION:
Screening mammogram in one year. (Code:T4-5-GWO)

BI-RADS CATEGORY  1: Negative.

## 2023-04-13 DIAGNOSIS — M9903 Segmental and somatic dysfunction of lumbar region: Secondary | ICD-10-CM | POA: Diagnosis not present

## 2023-04-13 DIAGNOSIS — M9905 Segmental and somatic dysfunction of pelvic region: Secondary | ICD-10-CM | POA: Diagnosis not present

## 2023-04-13 DIAGNOSIS — M9901 Segmental and somatic dysfunction of cervical region: Secondary | ICD-10-CM | POA: Diagnosis not present

## 2023-04-13 DIAGNOSIS — M9902 Segmental and somatic dysfunction of thoracic region: Secondary | ICD-10-CM | POA: Diagnosis not present

## 2023-05-04 DIAGNOSIS — M9905 Segmental and somatic dysfunction of pelvic region: Secondary | ICD-10-CM | POA: Diagnosis not present

## 2023-05-04 DIAGNOSIS — M9901 Segmental and somatic dysfunction of cervical region: Secondary | ICD-10-CM | POA: Diagnosis not present

## 2023-05-04 DIAGNOSIS — M9902 Segmental and somatic dysfunction of thoracic region: Secondary | ICD-10-CM | POA: Diagnosis not present

## 2023-05-04 DIAGNOSIS — M9903 Segmental and somatic dysfunction of lumbar region: Secondary | ICD-10-CM | POA: Diagnosis not present

## 2023-05-26 DIAGNOSIS — M9903 Segmental and somatic dysfunction of lumbar region: Secondary | ICD-10-CM | POA: Diagnosis not present

## 2023-05-26 DIAGNOSIS — M9905 Segmental and somatic dysfunction of pelvic region: Secondary | ICD-10-CM | POA: Diagnosis not present

## 2023-05-26 DIAGNOSIS — M9901 Segmental and somatic dysfunction of cervical region: Secondary | ICD-10-CM | POA: Diagnosis not present

## 2023-05-26 DIAGNOSIS — M9902 Segmental and somatic dysfunction of thoracic region: Secondary | ICD-10-CM | POA: Diagnosis not present

## 2023-05-31 ENCOUNTER — Other Ambulatory Visit: Payer: Self-pay

## 2023-05-31 DIAGNOSIS — Z3044 Encounter for surveillance of vaginal ring hormonal contraceptive device: Secondary | ICD-10-CM

## 2023-05-31 NOTE — Telephone Encounter (Signed)
Med refill request: [place] Vaginal Ring Last AEX: 03/17/23 Next AEX: not yet scheduled Last MMG (if hormonal med) 12/15/22, next scheduled 12/21/23 Refill authorized: Last Rx was filled #3 with 3 refills on 03/17/23. Please approve or deny as appropriate.

## 2023-08-18 DIAGNOSIS — M9903 Segmental and somatic dysfunction of lumbar region: Secondary | ICD-10-CM | POA: Diagnosis not present

## 2023-08-18 DIAGNOSIS — M9902 Segmental and somatic dysfunction of thoracic region: Secondary | ICD-10-CM | POA: Diagnosis not present

## 2023-08-18 DIAGNOSIS — M9905 Segmental and somatic dysfunction of pelvic region: Secondary | ICD-10-CM | POA: Diagnosis not present

## 2023-08-18 DIAGNOSIS — M9901 Segmental and somatic dysfunction of cervical region: Secondary | ICD-10-CM | POA: Diagnosis not present

## 2023-11-09 ENCOUNTER — Other Ambulatory Visit: Payer: Self-pay

## 2023-11-09 DIAGNOSIS — Z3044 Encounter for surveillance of vaginal ring hormonal contraceptive device: Secondary | ICD-10-CM

## 2023-11-09 MED ORDER — ETONOGESTREL-ETHINYL ESTRADIOL 0.12-0.015 MG/24HR VA RING
VAGINAL_RING | VAGINAL | 1 refills | Status: DC
Start: 2023-11-09 — End: 2024-04-26

## 2023-11-09 NOTE — Telephone Encounter (Signed)
 Med refill request: etonogestrel  vaginal ring Last AEX: 03/17/23 Next AEX: none scheduled Last MMG (if hormonal med) 12/15/22 BI-RADS 1 negative Patient changed pharmacy from [facility] to [facility] Refill authorized: etonogestrel  vaginal ring Please approve or deny as appropriate.

## 2023-11-25 DIAGNOSIS — M9902 Segmental and somatic dysfunction of thoracic region: Secondary | ICD-10-CM | POA: Diagnosis not present

## 2023-11-25 DIAGNOSIS — M9901 Segmental and somatic dysfunction of cervical region: Secondary | ICD-10-CM | POA: Diagnosis not present

## 2023-11-25 DIAGNOSIS — M9905 Segmental and somatic dysfunction of pelvic region: Secondary | ICD-10-CM | POA: Diagnosis not present

## 2023-11-25 DIAGNOSIS — M9903 Segmental and somatic dysfunction of lumbar region: Secondary | ICD-10-CM | POA: Diagnosis not present

## 2023-12-21 ENCOUNTER — Ambulatory Visit
Admission: RE | Admit: 2023-12-21 | Discharge: 2023-12-21 | Disposition: A | Discharge status: Home / Self Care | Payer: BC Managed Care – PPO | Source: Ambulatory Visit | Attending: Nurse Practitioner | Admitting: Nurse Practitioner

## 2023-12-21 DIAGNOSIS — Z1231 Encounter for screening mammogram for malignant neoplasm of breast: Secondary | ICD-10-CM

## 2023-12-28 DIAGNOSIS — M9902 Segmental and somatic dysfunction of thoracic region: Secondary | ICD-10-CM | POA: Diagnosis not present

## 2023-12-28 DIAGNOSIS — M9905 Segmental and somatic dysfunction of pelvic region: Secondary | ICD-10-CM | POA: Diagnosis not present

## 2023-12-28 DIAGNOSIS — M9901 Segmental and somatic dysfunction of cervical region: Secondary | ICD-10-CM | POA: Diagnosis not present

## 2023-12-28 DIAGNOSIS — M9903 Segmental and somatic dysfunction of lumbar region: Secondary | ICD-10-CM | POA: Diagnosis not present

## 2024-03-29 DIAGNOSIS — M9903 Segmental and somatic dysfunction of lumbar region: Secondary | ICD-10-CM | POA: Diagnosis not present

## 2024-03-29 DIAGNOSIS — M9902 Segmental and somatic dysfunction of thoracic region: Secondary | ICD-10-CM | POA: Diagnosis not present

## 2024-03-29 DIAGNOSIS — M9901 Segmental and somatic dysfunction of cervical region: Secondary | ICD-10-CM | POA: Diagnosis not present

## 2024-03-29 DIAGNOSIS — M9905 Segmental and somatic dysfunction of pelvic region: Secondary | ICD-10-CM | POA: Diagnosis not present

## 2024-04-05 ENCOUNTER — Ambulatory Visit: Admitting: Nurse Practitioner

## 2024-04-26 ENCOUNTER — Other Ambulatory Visit: Payer: Self-pay | Admitting: Nurse Practitioner

## 2024-04-26 DIAGNOSIS — Z3044 Encounter for surveillance of vaginal ring hormonal contraceptive device: Secondary | ICD-10-CM

## 2024-04-26 NOTE — Telephone Encounter (Signed)
 Med refill request:  etonogestrel -ethinyl estradiol  (NUVARING) 0.12-0.015 MG/24HR vaginal ring  Start 11/09/23 - Disp: 3 each - Refills: 1  Last AEX:  03/17/23 Next AEX:  05/03/24 Last MMG (if hormonal med):  12/21/23 Refill authorized? Please Advise.

## 2024-05-03 ENCOUNTER — Encounter: Payer: Self-pay | Admitting: Nurse Practitioner

## 2024-05-03 ENCOUNTER — Ambulatory Visit: Admitting: Nurse Practitioner

## 2024-05-03 VITALS — BP 116/72 | HR 74 | Ht 63.75 in | Wt 126.0 lb

## 2024-05-03 DIAGNOSIS — Z3044 Encounter for surveillance of vaginal ring hormonal contraceptive device: Secondary | ICD-10-CM

## 2024-05-03 DIAGNOSIS — Z01419 Encounter for gynecological examination (general) (routine) without abnormal findings: Secondary | ICD-10-CM

## 2024-05-03 DIAGNOSIS — Z1331 Encounter for screening for depression: Secondary | ICD-10-CM | POA: Diagnosis not present

## 2024-05-03 MED ORDER — ETONOGESTREL-ETHINYL ESTRADIOL 0.12-0.015 MG/24HR VA RING
VAGINAL_RING | VAGINAL | 3 refills | Status: DC
Start: 2024-05-03 — End: 2024-05-09

## 2024-05-03 NOTE — Progress Notes (Signed)
 Linda Lewis 10/22/1970 [id number]   History:  53 y.o. G2P1011 presents for annual exam. Monthly cycle on Nuvaring. Normal pap history.   Gynecologic History Patient's last menstrual period was 03/30/2024 (exact date). Period Duration (Days): 3 Period Pattern: Regular Menstrual Flow: Moderate Menstrual Control: Maxi pad Dysmenorrhea: (!) Mild Dysmenorrhea Symptoms: Cramping, Headache Contraception: NuvaRing vaginal inserts Sexually active: Yes  Health Maintenance Last Pap: 01/29/2020 Results were: Normal neg HPV Last mammogram: 12/21/2023. Results were: Normal Last colonoscopy: 04/09/2020. Results were: benign polyp x1 Last Dexa: Not indicated     05/03/2024    1:51 PM  Depression screen PHQ 2/9  Decreased Interest 0  Down, Depressed, Hopeless 0  PHQ - 2 Score 0     Past medical history, past surgical history, family history and social history were all reviewed and documented in the EPIC chart. Married. 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 Daughter lives local, married.   ROS:  A ROS was performed and pertinent positives and negatives are included.  Exam:  Vitals:   05/03/24 1344  BP: 116/72  Pulse: 74  SpO2: 98%  Weight: 126 lb (57.2 kg)  Height: 5' 3.75 (1.619 m)       Body mass index is 21.8 kg/m.  General appearance:  Normal Thyroid :  Symmetrical, normal in size, without palpable masses or nodularity. Respiratory  Auscultation:  Clear without wheezing or rhonchi Cardiovascular  Auscultation:  Regular rate, without rubs, murmurs or gallops  Edema/varicosities:  Not grossly evident Abdominal  Soft,nontender, without masses, guarding or rebound.  Liver/spleen:  No organomegaly noted  Hernia:  None appreciated  Skin  Inspection:  Grossly normal   Breasts: Examined lying and sitting.   Right: Without masses, retractions, discharge or axillary adenopathy.   Left: Without masses, retractions, discharge or axillary adenopathy. Pelvic: External  genitalia:  no lesions              Urethra:  normal appearing urethra with no masses, tenderness or lesions              Bartholins and Skenes: normal                 Vagina: normal appearing vagina with normal color and discharge, no lesions              Cervix: no lesions Bimanual Exam:  Uterus:  no masses or tenderness              Adnexa: no mass, fullness, tenderness              Rectovaginal: Deferred              Anus:  normal, no lesions  Zada Louder, CMA present as chaperone.  Assessment/Plan:  53 y.o. G0P1011 for annual exam.   Well female exam with routine gynecological exam -  Education provided on SBEs, importance of preventative screenings, current guidelines, high calcium diet, regular exercise, and multivitamin daily. Labs with PCP.   Encounter for surveillance of vaginal ring hormonal contraceptive device - Plan: etonogestrel -ethinyl estradiol  (NUVARING) 0.12-0.015 MG/24HR vaginal ring monthly. Refill x 1 year provided.  Screening for cervical cancer - Normal Pap history.  Will repeat at 5-year interval per guidelines.  Screening for breast cancer - Normal mammogram history.  Continue annual screenings.  Normal breast exam today.  Screening for colon cancer - 2021 colonoscopy. Will repeat at GI's recommended interval.   Return in about 1 year (around 05/03/2025) for Annual.        Celso Granja A  [facility], 2:10 PM 05/03/2024

## 2024-05-08 ENCOUNTER — Telehealth: Payer: Self-pay

## 2024-05-08 NOTE — Telephone Encounter (Signed)
 Patient called and left voice message stating that her vaginal ring is coming out with the use of tampons. She would like to try and alternative. Please advise.

## 2024-05-08 NOTE — Telephone Encounter (Signed)
 Can switch to pill or patch if she prefers. These have the same hormones that the vaginal ring has and she has done well on them. Is it coming out because the bleeding is light and it is sticking to the tampon? If so, she could try using a panty liner those light days. Of course up to her.

## 2024-05-09 ENCOUNTER — Other Ambulatory Visit: Payer: Self-pay | Admitting: Nurse Practitioner

## 2024-05-09 DIAGNOSIS — Z30011 Encounter for initial prescription of contraceptive pills: Secondary | ICD-10-CM

## 2024-05-09 MED ORDER — NORETHIN ACE-ETH ESTRAD-FE 1-20 MG-MCG PO TABS: 1.0000 | ORAL_TABLET | Freq: Every day | ORAL | 3 refills | Status: DC

## 2024-05-09 NOTE — Telephone Encounter (Signed)
 Sent. Thanks.

## 2024-05-09 NOTE — Telephone Encounter (Signed)
 I spoke to patient and she would like to switch to the pill. She states the vaginal ring just comes out. It has fallen out in the toilet when she uses bathroom, she has found it in the bed in the mornings. Its just not working for her. She would like a 90 day supply of the pill if possible.

## 2024-07-24 ENCOUNTER — Ambulatory Visit: Payer: Self-pay | Admitting: Family Medicine

## 2024-07-24 ENCOUNTER — Encounter: Payer: Self-pay | Admitting: Family Medicine

## 2024-07-24 VITALS — BP 114/72 | HR 64 | Temp 98.0°F | Ht 63.75 in | Wt 125.0 lb

## 2024-07-24 DIAGNOSIS — Z13220 Encounter for screening for lipoid disorders: Secondary | ICD-10-CM

## 2024-07-24 DIAGNOSIS — Z1329 Encounter for screening for other suspected endocrine disorder: Secondary | ICD-10-CM

## 2024-07-24 DIAGNOSIS — R7302 Impaired glucose tolerance (oral): Secondary | ICD-10-CM

## 2024-07-24 DIAGNOSIS — Z0000 Encounter for general adult medical examination without abnormal findings: Secondary | ICD-10-CM

## 2024-07-24 DIAGNOSIS — Z136 Encounter for screening for cardiovascular disorders: Secondary | ICD-10-CM

## 2024-07-24 NOTE — Progress Notes (Signed)
 "  New Patient Office Visit  Subjective    Patient ID: Linda Lewis, female    DOB: [birth year]  Age: 54 y.o. MRN: [id number]  CC:  Chief Complaint  Patient presents with   Establish Care    HPI Linda Lewis presents to establish care and physical.  No diet plan. Pt has excessive job but does do yoga some. Walking a lot while at work.  Sleep is good.  Last eye exam: 1/25 Last Dental exam: 6/26  Plans to get tdap and shingles.    Outpatient Encounter Medications as of 07/24/2024  Medication Sig   B Complex-C (SUPER B COMPLEX PO) Take 1 tablet by mouth daily.   etonogestrel -ethinyl estradiol  (NUVARING) 0.12-0.015 MG/24HR vaginal ring Place 1 each vaginally every 28 (twenty-eight) days.   Multiple Vitamins-Iron (MULTIVITAMIN/IRON PO) Take 1 tablet by mouth in the morning and at bedtime.   Nutritional Supplements (DHEA PO) Take 1 tablet by mouth daily.   Omega-3 Fatty Acids (FISH OIL PO) Take 1 tablet by mouth in the morning and at bedtime.   norethindrone-ethinyl estradiol -FE (LOESTRIN FE) 1-20 MG-MCG tablet Take 1 tablet by mouth daily.   No facility-administered encounter medications on file as of 07/24/2024.    Past Medical History:  Diagnosis Date   No pertinent past medical history     Past Surgical History:  Procedure Laterality Date   NO PAST SURGERIES      Family History  Problem Relation Age of Onset   Hypertension Mother    Alcoholism Father    Colon cancer Neg Hx    Esophageal cancer Neg Hx    Stomach cancer Neg Hx    Rectal cancer Neg Hx     Social History   Socioeconomic History   Marital status: Married    Spouse name: Not on file   Number of children: 1   Years of education: Not on file   Highest education level: Not on file  Occupational History   Occupation: Chef  Tobacco Use   Smoking status: Never   Smokeless tobacco: Never  Vaping Use   Vaping status: Never Used  Substance and Sexual Activity   Alcohol use: Yes    Comment:  1 per day   Drug use: No   Sexual activity: Yes    Partners: Male    Birth control/protection: Inserts    Comment: NUVARING  Other Topics Concern   Not on file  Social History Narrative   Not on file   Social Drivers of Health   Tobacco Use: Low Risk (07/24/2024)   Patient History    Smoking Tobacco Use: Never    Smokeless Tobacco Use: Never    Passive Exposure: Not on file  Financial Resource Strain: Not on file  Food Insecurity: Not on file  Transportation Needs: Not on file  Physical Activity: Not on file  Stress: Not on file  Social Connections: Unknown (11/24/2021)   [facility]   Social Network    Social Network: Not on file  Intimate Partner Violence: Unknown (10/16/2021)   Received from [facility]   HITS    Physically Hurt: Not on file    Insult or Talk Down To: Not on file    Threaten Physical Harm: Not on file    Scream or Curse: Not on file  Depression (PHQ2-9): Low Risk (07/24/2024)   Depression (PHQ2-9)    PHQ-2 Score: 0  Alcohol Screen: Not on file  Housing: Not on file  Utilities: Not  on file  Health Literacy: Not on file    Review of Systems  All other systems reviewed and are negative.       Objective    BP 114/72 (BP Location: Left Arm, Patient Position: Sitting, Cuff Size: Normal)   Pulse 64   Temp 98 F (36.7 C) (Oral)   Ht 5' 3.75 (1.619 m)   Wt 125 lb (56.7 kg)   LMP  (LMP Unknown)   SpO2 98%   BMI 21.62 kg/m   Physical Exam Vitals and nursing note reviewed.  Constitutional:      Appearance: Normal appearance. She is normal weight.  HENT:     Head: Normocephalic and atraumatic.     Right Ear: Tympanic membrane, ear canal and external ear normal.     Left Ear: Tympanic membrane, ear canal and external ear normal.     Nose: Nose normal.     Mouth/Throat:     Mouth: Mucous membranes are moist.     Pharynx: Oropharynx is clear.  Eyes:     Conjunctiva/sclera: Conjunctivae normal.     Pupils: Pupils are equal,  round, and reactive to light.  Cardiovascular:     Rate and Rhythm: Normal rate and regular rhythm.     Pulses: Normal pulses.     Heart sounds: Normal heart sounds.  Pulmonary:     Effort: Pulmonary effort is normal.     Breath sounds: Normal breath sounds.  Abdominal:     General: Abdomen is flat. Bowel sounds are normal.  Skin:    General: Skin is warm.     Capillary Refill: Capillary refill takes less than 2 seconds.  Neurological:     General: No focal deficit present.     Mental Status: She is alert and oriented to person, place, and time. Mental status is at baseline.  Psychiatric:        Mood and Affect: Mood normal.        Behavior: Behavior normal.        Thought Content: Thought content normal.        Judgment: Judgment normal.      Assessment & Plan:   Problem List Items Addressed This Visit   None   No follow-ups on file.   Torrence CINDERELLA Barrier, MD   "

## 2024-07-27 ENCOUNTER — Ambulatory Visit: Payer: Self-pay | Admitting: Family Medicine

## 2024-07-27 LAB — CBC WITH DIFFERENTIAL/PLATELET
Basophils Absolute: 0.1 x10E3/uL (ref 0.0–0.2)
Basos: 2 %
EOS (ABSOLUTE): 0.1 x10E3/uL (ref 0.0–0.4)
Eos: 2 %
Hematocrit: 42.5 % (ref 34.0–46.6)
Hemoglobin: 14.2 g/dL (ref 11.1–15.9)
Immature Grans (Abs): 0 x10E3/uL (ref 0.0–0.1)
Immature Granulocytes: 0 %
Lymphocytes Absolute: 1.3 x10E3/uL (ref 0.7–3.1)
Lymphs: 27 %
MCH: 32.9 pg (ref 26.6–33.0)
MCHC: 33.4 g/dL (ref 31.5–35.7)
MCV: 99 fL — ABNORMAL HIGH (ref 79–97)
Monocytes Absolute: 0.4 x10E3/uL (ref 0.1–0.9)
Monocytes: 9 %
Neutrophils Absolute: 2.8 x10E3/uL (ref 1.4–7.0)
Neutrophils: 60 %
Platelets: 282 x10E3/uL (ref 150–450)
RBC: 4.31 x10E6/uL (ref 3.77–5.28)
RDW: 11.8 % (ref 11.7–15.4)
WBC: 4.7 x10E3/uL (ref 3.4–10.8)

## 2024-07-27 LAB — COMPREHENSIVE METABOLIC PANEL WITH GFR
ALT: 20 IU/L (ref 0–32)
AST: 17 IU/L (ref 0–40)
Albumin: 4.1 g/dL (ref 3.8–4.9)
Alkaline Phosphatase: 87 IU/L (ref 49–135)
BUN/Creatinine Ratio: 16 (ref 9–23)
BUN: 13 mg/dL (ref 6–24)
Bilirubin Total: 0.5 mg/dL (ref 0.0–1.2)
CO2: 23 mmol/L (ref 20–29)
Calcium: 9.2 mg/dL (ref 8.7–10.2)
Chloride: 103 mmol/L (ref 96–106)
Creatinine, Ser: 0.79 mg/dL (ref 0.57–1.00)
Globulin, Total: 2.6 g/dL (ref 1.5–4.5)
Glucose: 86 mg/dL (ref 70–99)
Potassium: 4.5 mmol/L (ref 3.5–5.2)
Sodium: 139 mmol/L (ref 134–144)
Total Protein: 6.7 g/dL (ref 6.0–8.5)
eGFR: 89 mL/min/1.73

## 2024-07-27 LAB — TSH: TSH: 1.01 u[IU]/mL (ref 0.450–4.500)

## 2024-07-27 LAB — HEMOGLOBIN A1C
Est. average glucose Bld gHb Est-mCnc: 105 mg/dL
Hgb A1c MFr Bld: 5.3 % (ref 4.8–5.6)

## 2024-07-27 LAB — LIPID PANEL
Chol/HDL Ratio: 2.2 ratio (ref 0.0–4.4)
Cholesterol, Total: 156 mg/dL (ref 100–199)
HDL: 72 mg/dL
LDL Chol Calc (NIH): 68 mg/dL (ref 0–99)
Triglycerides: 86 mg/dL (ref 0–149)
VLDL Cholesterol Cal: 16 mg/dL (ref 5–40)

## 2024-07-27 LAB — T4, FREE: Free T4: 1.12 ng/dL (ref 0.82–1.77)

## 2025-01-10 ENCOUNTER — Ambulatory Visit: Payer: Self-pay

## 2025-07-30 ENCOUNTER — Encounter: Payer: Self-pay | Admitting: Family Medicine
